# Patient Record
Sex: Female | Born: 1970 | Race: Black or African American | Hispanic: No | Marital: Single | Smoking: Never smoker
Health system: Southern US, Community
[De-identification: ages and names within clinical notes are randomized; demographics above are authoritative.]

## PROBLEM LIST (undated history)

## (undated) HISTORY — PX: ABDOMINAL HYSTERECTOMY: SHX81

---

## 2010-09-13 ENCOUNTER — Emergency Department (HOSPITAL_COMMUNITY): Payer: BC Managed Care – PPO

## 2010-09-13 ENCOUNTER — Emergency Department (HOSPITAL_COMMUNITY)
Admission: EM | Admit: 2010-09-13 | Discharge: 2010-09-13 | Disposition: A | Discharge status: Home / Self Care | Payer: BC Managed Care – PPO | Attending: Emergency Medicine | Admitting: Emergency Medicine

## 2010-09-13 DIAGNOSIS — D649 Anemia, unspecified: Secondary | ICD-10-CM | POA: Insufficient documentation

## 2010-09-13 DIAGNOSIS — R079 Chest pain, unspecified: Secondary | ICD-10-CM | POA: Insufficient documentation

## 2010-09-13 DIAGNOSIS — M940 Chondrocostal junction syndrome [Tietze]: Secondary | ICD-10-CM | POA: Insufficient documentation

## 2010-09-13 LAB — DIFFERENTIAL
Basophils Absolute: 0.1 10*3/uL (ref 0.0–0.1)
Lymphs Abs: 2.1 10*3/uL (ref 0.7–4.0)
Monocytes Absolute: 0.9 10*3/uL (ref 0.1–1.0)
Neutro Abs: 1.9 10*3/uL (ref 1.7–7.7)

## 2010-09-13 LAB — BASIC METABOLIC PANEL
Calcium: 9.5 mg/dL (ref 8.4–10.5)
Creatinine, Ser: 0.69 mg/dL (ref 0.50–1.10)
GFR calc non Af Amer: >60 mL/min (ref >60)
Glucose, Bld: 92 mg/dL (ref 70–99)
Sodium: 137 mEq/L (ref 135–145)

## 2010-09-13 LAB — TROPONIN I: Troponin I: <0.3 ng/mL (ref <0.30)

## 2010-09-13 LAB — CBC
MCH: 21.6 pg — ABNORMAL LOW (ref 26.0–34.0)
MCHC: 31.9 g/dL (ref 30.0–36.0)
MCV: 67.5 fL — ABNORMAL LOW (ref 78.0–100.0)
Platelets: 270 10*3/uL (ref 150–400)

## 2015-03-17 ENCOUNTER — Encounter (HOSPITAL_COMMUNITY): Payer: Self-pay | Admitting: Emergency Medicine

## 2015-03-17 ENCOUNTER — Emergency Department (HOSPITAL_COMMUNITY)
Admission: EM | Admit: 2015-03-17 | Discharge: 2015-03-17 | Disposition: A | Discharge status: Home / Self Care | Payer: BLUE CROSS/BLUE SHIELD | Attending: Emergency Medicine | Admitting: Emergency Medicine

## 2015-03-17 DIAGNOSIS — Z79899 Other long term (current) drug therapy: Secondary | ICD-10-CM | POA: Insufficient documentation

## 2015-03-17 DIAGNOSIS — R0981 Nasal congestion: Secondary | ICD-10-CM | POA: Diagnosis present

## 2015-03-17 DIAGNOSIS — B349 Viral infection, unspecified: Secondary | ICD-10-CM

## 2015-03-17 DIAGNOSIS — M791 Myalgia, unspecified site: Secondary | ICD-10-CM

## 2015-03-17 LAB — URINALYSIS, ROUTINE W REFLEX MICROSCOPIC
BILIRUBIN URINE: NEGATIVE
Glucose, UA: NEGATIVE mg/dL
HGB URINE DIPSTICK: NEGATIVE
Ketones, ur: NEGATIVE mg/dL
Leukocytes, UA: NEGATIVE
NITRITE: NEGATIVE
PROTEIN: NEGATIVE mg/dL
Specific Gravity, Urine: 1.024 (ref 1.005–1.030)
pH: 5.5 (ref 5.0–8.0)

## 2015-03-17 MED ORDER — KETOROLAC TROMETHAMINE 30 MG/ML IJ SOLN
30.0000 mg | Freq: Once | INTRAMUSCULAR | Status: AC
  Administered 2015-03-17: 30 mg via INTRAMUSCULAR
  Filled 2015-03-17: qty 1

## 2015-03-17 MED ORDER — ACETAMINOPHEN 325 MG PO TABS
650.0000 mg | ORAL_TABLET | Freq: Once | ORAL | Status: AC
  Administered 2015-03-17: 650 mg via ORAL
  Filled 2015-03-17: qty 2

## 2015-03-17 MED ORDER — OSELTAMIVIR PHOSPHATE 75 MG PO CAPS
75.0000 mg | ORAL_CAPSULE | Freq: Once | ORAL | Status: AC
  Administered 2015-03-17: 75 mg via ORAL
  Filled 2015-03-17: qty 1

## 2015-03-17 MED ORDER — FLUTICASONE PROPIONATE 50 MCG/ACT NA SUSP: 1.0000 | Freq: Every day | NASAL | Status: DC

## 2015-03-17 MED ORDER — IBUPROFEN 800 MG PO TABS: 800.0000 mg | ORAL_TABLET | Freq: Three times a day (TID) | ORAL | Status: DC

## 2015-03-17 MED ORDER — OSELTAMIVIR PHOSPHATE 75 MG PO CAPS: 75.0000 mg | ORAL_CAPSULE | Freq: Two times a day (BID) | ORAL | Status: DC

## 2015-03-17 NOTE — ED Notes (Addendum)
Friday morning began with nasal congestion, generalized body aches, and hot/cold chills. No cough, has used OTC medications without relief. Has been exposed to sick family member.

## 2015-03-17 NOTE — Discharge Instructions (Signed)
I will treat you empirically for the flu. We did not do a flu swab today. I will give you a prescription for Tamiflu to help with your symptoms. I will also give you prescriptions for nasal spray and ibuprofen.   Your urine test was completely normal today.  Take medications as prescribed. Return to the emergency room for worsening condition or new concerning symptoms. Follow up with your regular doctor. If you don't have a regular doctor use one of the numbers below to establish a primary care doctor.   Emergency Department Resource Guide 1) Find a Doctor and Pay Out of Pocket Although you won't have to find out who is covered by your insurance plan, it is a good idea to ask around and get recommendations. You will then need to call the office and see if the doctor you have chosen will accept you as a new patient and what types of options they offer for patients who are self-pay. Some doctors offer discounts or will set up payment plans for their patients who do not have insurance, but you will need to ask so you aren't surprised when you get to your appointment.  2) Contact Your Local Health Department Not all health departments have doctors that can see patients for sick visits, but many do, so it is worth a call to see if yours does. If you don't know where your local health department is, you can check in your phone book. The CDC also has a tool to help you locate your state's health department, and many state websites also have listings of all of their local health departments.  3) Find a Walk-in Clinic If your illness is not likely to be very severe or complicated, you may want to try a walk in clinic. These are popping up all over the country in pharmacies, drugstores, and shopping centers. They're usually staffed by nurse practitioners or physician assistants that have been trained to treat common illnesses and complaints. They're usually fairly quick and inexpensive. However, if you have  serious medical issues or chronic medical problems, these are probably not your best option.  No Primary Care Doctor: - Call Health Connect at  [phone] - they can help you locate a primary care doctor that  accepts your insurance, provides certain services, etc. - Physician Referral [phone]  Emergency Department Resource Guide 1) Find a Doctor and Pay Out of Pocket Although you won't have to find out who is covered by your insurance plan, it is a good idea to ask around and get recommendations. You will then need to call the office and see if the doctor you have chosen will accept you as a new patient and what types of options they offer for patients who are self-pay. Some doctors offer discounts or will set up payment plans for their patients who do not have insurance, but you will need to ask so you aren't surprised when you get to your appointment.  2) Contact Your Local Health Department Not all health departments have doctors that can see patients for sick visits, but many do, so it is worth a call to see if yours does. If you don't know where your local health department is, you can check in your phone book. The CDC also has a tool to help you locate your state's health department, and many state websites also have listings of all of their local health departments.  3) Find a Walk-in Clinic If your illness is not likely to  be very severe or complicated, you may want to try a walk in clinic. These are popping up all over the country in pharmacies, drugstores, and shopping centers. They're usually staffed by nurse practitioners or physician assistants that have been trained to treat common illnesses and complaints. They're usually fairly quick and inexpensive. However, if you have serious medical issues or chronic medical problems, these are probably not your best option.  No Primary Care Doctor: - Call Health Connect at  [phone] - they can help you locate a primary care doctor  that  accepts your insurance, provides certain services, etc. - Physician Referral Service- [phone]  Chronic Pain Problems: Organization         Address  Phone   Notes  [facility]  [phone] Patients need to be referred by their primary care doctor.   Medication Assistance: Organization         Address  Phone   Notes  [facility] Medication [facility] [address] [place] [place], [place] [address] [phone] --Must be a resident of [facility] -- Must have NO insurance coverage whatsoever (no Medicaid/ Medicare, etc.) -- The pt. MUST have a primary care doctor that directs their care regularly and follows them in the community   [facility]  [phone]   [employer]  [phone]    Agencies that provide inexpensive medical care: Organization         Address  Phone   Notes  [facility]  [phone]   [facility]    [phone]   [facility] [address] [place], [place] [address] [phone]   [facility] [address] [place] [place] [phone]   [facility]anned Parenthood    [phone]   [facility]    [phone]   [facility]  [address], [place] Phone:  [phone], Fax:  [phone] Hours of Operation:  9 am - 6 pm, M-F.  Also accepts Medicaid/Medicare and self-pay.  [facility]  [address], [place] Phone: [phone], Fax: [phone]. Hours of Operation:  8:30 am - 5:30 pm, M-F.  Also accepts Medicaid and self-pay.  [facility] [place] [address] [place] Phone: [phone]   [facility] [address] Natasha Bence [place], [place] [phone], Ext. [phone] Mondays & Thursdays: 7-9 AM.  First 15 patients are seen on a first come, first serve basis.    Medicaid-accepting [facility] Providers:  Organization          Address  Phone   Notes  [facility] [address], [place] [phone] Also accepts self-pay patients.  [facility] [address] Laurell Josephs [place], [place]  [phone]   [facility] [address], [place] [phone]   [facility] [address], [place] [phone]   Renaye Rakers [address], [place]   [phone] Only accepts [place] patients after they have their name applied to their card.   Self-Pay (no insurance) in [facility]:  Organization         Address  Phone   Notes  Sickle Cell Patients, [facility] [address] [place], [place] [phone]   [facility] [place] [phone]   [facility] [address], [place] [phone]   [facility]/Dr. Osei-Bonsu  [address], [place] [address], [place] [phone] Phone number for both [place] and [place] locations is the same.  [facility] and [facility] [address] [place] [phone]   [facility] [address], [place] or [address] [phone]   [facility] [address] [place] [phone], phone; [phone], fax Sees patients 1st and 3rd Saturday of every month.  Must not qualify for public or private insurance (i.e. Medicaid, Medicare, [place] Health Choice, Veterans' Benefits)  Household income should be no more than 200% of the poverty level The clinic cannot treat you if you are pregnant or think you are pregnant  Sexually transmitted diseases are not treated at the clinic.

## 2015-03-17 NOTE — ED Provider Notes (Signed)
CSN: [id number]     Arrival date & time 03/17/15  0848 History   First MD Initiated Contact with Patient 03/17/15 [phone]     Chief Complaint  Patient presents with  . Generalized Body Aches  . Nasal Congestion    HPI   Carmen Burnett is an 45 y.o. female with no significant PMH who presents to the ED for evaluation of nasal congestion, body aches, fever, chills. She stats her symptoms began two days ago. Denies cough. Denies chest pain, SOB. She states she is unsure of temperature at home but has felt subjectively feverish. Denies abdominal pain, n/v/d. She states she has tried OTC meds such as Theraflu with no relief. Denies sick contacts.  History reviewed. No pertinent past medical history. Past Surgical History  Procedure Laterality Date  . Abdominal hysterectomy     History reviewed. No pertinent family history. Social History  Substance Use Topics  . Smoking status: None  . Smokeless tobacco: None  . Alcohol Use: None   OB History    No data available     Review of Systems  All other systems reviewed and are negative.     Allergies  Review of patient's allergies indicates no known allergies.  Home Medications   Prior to Admission medications   Medication Sig Start Date End Date Taking? Authorizing Provider  Calcium Carbonate (CALTRATE 600 PO) Take 2 tablets by mouth daily.   Yes Historical Provider, MD  Phenyleph-Doxylamine-DM-APAP (ALKA SELTZER PLUS PO) Take 2 tablets by mouth 3 (three) times daily as needed (for congestion and cold symptoms).   Yes Historical Provider, MD   BP 144/100 mmHg  Pulse 97  Temp(Src) 98.1 F (36.7 C) (Oral)  Resp 18  SpO2 100% Physical Exam  Constitutional: She is oriented to person, place, and time. No distress.  HENT:  Head: Atraumatic.  Right Ear: External ear normal.  Left Ear: External ear normal.  Nose: Mucosal edema and rhinorrhea present.  Mouth/Throat: Oropharynx is clear and moist. No oropharyngeal exudate.  Eyes:  Conjunctivae are normal. No scleral icterus.  Neck: Normal range of motion. Neck supple.  Cardiovascular: Normal rate, regular rhythm and normal heart sounds.   Pulmonary/Chest: Effort normal and breath sounds normal. No respiratory distress. She has no wheezes. She exhibits no tenderness.  Abdominal: Soft. She exhibits no distension. There is no tenderness. There is no CVA tenderness.  Neurological: She is alert and oriented to person, place, and time.  Skin: Skin is warm and dry. She is not diaphoretic.  Psychiatric: She has a normal mood and affect. Her behavior is normal.  Nursing note and vitals reviewed.  Filed Vitals:   03/17/15 0859 03/17/15 1033  BP: 144/100 141/95  Pulse: 97 70  Temp: 98.1 F (36.7 C)   TempSrc: Oral   Resp: 18 16  SpO2: 100% 98%     ED Course  Procedures (including critical care time) Labs Review Labs Reviewed  URINALYSIS, ROUTINE W REFLEX MICROSCOPIC (NOT AT [facility])    Imaging Review No results found. I have personally reviewed and evaluated these images and lab results as part of my medical decision-making.   EKG Interpretation None      MDM   Final diagnoses:  Viral syndrome  Myalgia  Nasal congestion    Will treat empirically for flu. First dose tamiflu given in the ED. Pt also given Toradol and tylenol. She does report improvement of symptoms. I was notified by RN that pt reports two weeks of intermittent right  flank pain and dysuria. She would like a UA. On re-eval pt does not have any CVA or abdominal tenderness. Will check UA.  UA negative. Discussed with pt. At this time will hold off on further workup. SHe is nontoxic appearing and afebrile. I do suspect she has viral illness, possibly flu. Rx given for tamiflu, motrin, and flonase. Instructed to f/u with PCP. ER return precautions given.    Carlene Coria, PA-C 03/18/15 1459  Zadie Rhine, MD 03/18/15 [phone]

## 2015-03-17 NOTE — ED Notes (Signed)
Pt adds that she has R sided back and flank pain x2 weeks with some burning upon urination. Pt requests to have urine checked. Casimer Bilis made aware

## 2016-12-11 ENCOUNTER — Encounter (HOSPITAL_COMMUNITY): Payer: Self-pay | Admitting: Nurse Practitioner

## 2016-12-11 DIAGNOSIS — Z5321 Procedure and treatment not carried out due to patient leaving prior to being seen by health care provider: Secondary | ICD-10-CM | POA: Insufficient documentation

## 2016-12-11 DIAGNOSIS — M791 Myalgia, unspecified site: Secondary | ICD-10-CM | POA: Insufficient documentation

## 2016-12-11 NOTE — ED Triage Notes (Signed)
Pt states since she received the flu shot about six weeks ago, she has been experiencing flu-like symptoms in particular body aches.

## 2016-12-12 ENCOUNTER — Emergency Department (HOSPITAL_COMMUNITY)
Admission: EM | Admit: 2016-12-12 | Discharge: 2016-12-12 | Discharge status: Against Medical Advice | Payer: BLUE CROSS/BLUE SHIELD | Attending: Emergency Medicine | Admitting: Emergency Medicine

## 2016-12-12 NOTE — ED Notes (Signed)
No answer for room. Not seen in lobby. 

## 2016-12-12 NOTE — ED Notes (Signed)
Pt turned in her stickers to registration. LWBS

## 2017-09-20 ENCOUNTER — Other Ambulatory Visit: Payer: Self-pay

## 2017-09-20 ENCOUNTER — Emergency Department (HOSPITAL_COMMUNITY): Payer: Self-pay

## 2017-09-20 ENCOUNTER — Encounter (HOSPITAL_COMMUNITY): Payer: Self-pay | Admitting: *Deleted

## 2017-09-20 ENCOUNTER — Emergency Department (HOSPITAL_COMMUNITY)
Admission: EM | Admit: 2017-09-20 | Discharge: 2017-09-20 | Disposition: A | Discharge status: Home / Self Care | Payer: Self-pay | Attending: Emergency Medicine | Admitting: Emergency Medicine

## 2017-09-20 DIAGNOSIS — Z79899 Other long term (current) drug therapy: Secondary | ICD-10-CM | POA: Insufficient documentation

## 2017-09-20 DIAGNOSIS — R0789 Other chest pain: Secondary | ICD-10-CM | POA: Insufficient documentation

## 2017-09-20 DIAGNOSIS — R202 Paresthesia of skin: Secondary | ICD-10-CM | POA: Insufficient documentation

## 2017-09-20 LAB — I-STAT TROPONIN, ED
TROPONIN I, POC: 0 ng/mL (ref 0.00–0.08)
TROPONIN I, POC: 0.01 ng/mL (ref 0.00–0.08)

## 2017-09-20 LAB — CBC
HCT: 40.7 % (ref 36.0–46.0)
Hemoglobin: 12.4 g/dL (ref 12.0–15.0)
MCH: 23.3 pg — ABNORMAL LOW (ref 26.0–34.0)
MCHC: 30.5 g/dL (ref 30.0–36.0)
MCV: 76.5 fL — ABNORMAL LOW (ref 78.0–100.0)
Platelets: 273 10*3/uL (ref 150–400)
RBC: 5.32 MIL/uL — AB (ref 3.87–5.11)
RDW: 15.9 % — ABNORMAL HIGH (ref 11.5–15.5)
WBC: 6.4 10*3/uL (ref 4.0–10.5)

## 2017-09-20 LAB — BASIC METABOLIC PANEL
Anion gap: 12 (ref 5–15)
BUN: 17 mg/dL (ref 6–20)
CALCIUM: 9.1 mg/dL (ref 8.9–10.3)
CO2: 25 mmol/L (ref 22–32)
CREATININE: 0.75 mg/dL (ref 0.44–1.00)
Chloride: 102 mmol/L (ref 98–111)
Glucose, Bld: 104 mg/dL — ABNORMAL HIGH (ref 70–99)
Potassium: 3.9 mmol/L (ref 3.5–5.1)
SODIUM: 139 mmol/L (ref 135–145)

## 2017-09-20 LAB — D-DIMER, QUANTITATIVE (NOT AT ARMC)

## 2017-09-20 MED ORDER — GI COCKTAIL ~~LOC~~
30.0000 mL | Freq: Once | ORAL | Status: AC
  Administered 2017-09-20: 30 mL via ORAL
  Filled 2017-09-20: qty 30

## 2017-09-20 MED ORDER — KETOROLAC TROMETHAMINE 30 MG/ML IJ SOLN
30.0000 mg | Freq: Once | INTRAMUSCULAR | Status: AC
  Administered 2017-09-20: 30 mg via INTRAVENOUS
  Filled 2017-09-20: qty 1

## 2017-09-20 MED ORDER — METHYLPREDNISOLONE 4 MG PO TBPK: ORAL_TABLET | ORAL | 0 refills | Status: DC

## 2017-09-20 NOTE — ED Notes (Signed)
Taken to xray at this time. 

## 2017-09-20 NOTE — ED Provider Notes (Addendum)
[facility] EMERGENCY DEPARTMENT Provider Note   CSN: [id number] Arrival date & time: 09/20/17  0404     History   Chief Complaint Chief Complaint  Patient presents with  . Chest Pain    HPI Carmen Burnett is a 47 y.o. female.  Patient presents with central chest pressure that has been constant since about midnight.  It does wax and wane in severity but never goes away completely.  She describes ongoing numbness and tingling in her right arm which has been an issue for the past month which she has seen her PCP for and given Flexeril and diclofenac.  She reports there is no change in the numbness in her right arm today.  No weakness.  She noticed some left arm numbness ongoing when she had the chest pain this morning.  She denies any shortness of breath, nausea, vomiting, diaphoresis or syncope.  Chest pain is better with sitting up and worse with lying down.  It is not exertional or pleuritic.  Denies any abdominal pain.  She reports no cardiac history but is never had a stress test.  Denies any history of hypertension, diabetes, high cholesterol, or tobacco abuse.  She did not try to take any medications at home.  She reports the right arm numbness is the entire arm and is unchanged today. She reports she did hit her head last month several days before the R arm numbness started. But she never did have midline neck pain.  The history is provided by the patient.  Chest Pain   Associated symptoms include numbness and shortness of breath. Pertinent negatives include no abdominal pain, no dizziness, no fever, no headaches, no nausea and no vomiting.    History reviewed. No pertinent past medical history.  There are no active problems to display for this patient.   Past Surgical History:  Procedure Laterality Date  . ABDOMINAL HYSTERECTOMY       OB History   None      Home Medications    Prior to Admission medications   Medication Sig Start Date End Date  Taking? Authorizing Provider  cyclobenzaprine (FLEXERIL) 10 MG tablet Take 10 mg by mouth 3 (three) times daily as needed for muscle spasms.  08/28/17  Yes [provider]  diclofenac (VOLTAREN) 75 MG EC tablet Take 75 mg by mouth 2 (two) times daily. 08/28/17  Yes [provider]  fluticasone (FLONASE) 50 MCG/ACT nasal spray Place 1 spray into both nostrils daily. Patient not taking: Reported on 09/20/2017 03/17/15   Sam, Ace Gins, PA-C  ibuprofen (ADVIL,MOTRIN) 800 MG tablet Take 1 tablet (800 mg total) by mouth 3 (three) times daily. Patient not taking: Reported on 09/20/2017 03/17/15   Carlene Coria, PA-C  oseltamivir (TAMIFLU) 75 MG capsule Take 1 capsule (75 mg total) by mouth every 12 (twelve) hours. Patient not taking: Reported on 09/20/2017 03/17/15   Carlene Coria, PA-C    Family History No family history on file.  Social History Social History   Tobacco Use  . Smoking status: Never Smoker  . Smokeless tobacco: Never Used  Substance Use Topics  . Alcohol use: Yes  . Drug use: Not Currently     Allergies   Patient has no known allergies.   Review of Systems Review of Systems  Constitutional: Negative for activity change, appetite change and fever.  Eyes: Negative for visual disturbance.  Respiratory: Positive for chest tightness and shortness of breath.   Cardiovascular: Positive for chest  pain.  Gastrointestinal: Negative for abdominal pain, nausea and vomiting.  Genitourinary: Negative for dysuria, pelvic pain, vaginal bleeding and vaginal discharge.  Musculoskeletal: Negative for arthralgias and myalgias.  Skin: Negative for rash.  Neurological: Positive for numbness. Negative for dizziness and headaches.   all other systems are negative except as noted in the HPI and PMH.     Physical Exam Updated Vital Signs BP (!) 151/88   Pulse 73   Temp 97.8 F (36.6 C) (Oral)   Resp 18   SpO2 100%   Physical Exam  Constitutional: She is oriented to  person, place, and time. She appears well-developed and well-nourished. No distress.  Appears anxious  HENT:  Head: Normocephalic and atraumatic.  Mouth/Throat: Oropharynx is clear and moist. No oropharyngeal exudate.  Eyes: Pupils are equal, round, and reactive to light. Conjunctivae and EOM are normal.  Neck: Normal range of motion. Neck supple.  No meningismus.  Cardiovascular: Normal rate, regular rhythm, normal heart sounds and intact distal pulses.  No murmur heard. Pulmonary/Chest: Effort normal and breath sounds normal. No respiratory distress. She exhibits no tenderness.  Abdominal: Soft. There is no tenderness. There is no rebound and no guarding.  Musculoskeletal: Normal range of motion. She exhibits no edema or tenderness.  Subjective numbness in the right arm extending from paraspinal cervical musculature all the way down the arm.  Grip strengths are equal bilaterally with intact radial pulses.  Neurological: She is alert and oriented to person, place, and time. No cranial nerve deficit. She exhibits normal muscle tone. Coordination normal.   5/5 strength throughout. CN 2-12 intact.Equal grip strength.   Skin: Skin is warm. Capillary refill takes less than 2 seconds.  Psychiatric: She has a normal mood and affect. Her behavior is normal.  Nursing note and vitals reviewed.    ED Treatments / Results  Labs (all labs ordered are listed, but only abnormal results are displayed) Labs Reviewed  BASIC METABOLIC PANEL - Abnormal; Notable for the following components:      Result Value   Glucose, Bld 104 (*)    All other components within normal limits  CBC - Abnormal; Notable for the following components:   RBC 5.32 (*)    MCV 76.5 (*)    MCH 23.3 (*)    RDW 15.9 (*)    All other components within normal limits  D-DIMER, QUANTITATIVE (NOT AT [facility])  I-STAT TROPONIN, ED    EKG EKG Interpretation  Date/Time:  Monday September 20 2017 04:11:41 EDT Ventricular Rate:  76 PR  Interval:    QRS Duration: 86 QT Interval:  398 QTC Calculation: 448 R Axis:   97 Text Interpretation:  Sinus rhythm Borderline right axis deviation No acute changes No significant change since last tracing Confirmed by Derwood Kaplan ([id number]) on 09/20/2017 4:27:17 AM   Radiology Dg Chest 2 View  Result Date: 09/20/2017 CLINICAL DATA:  47 year old female with chest pain. EXAM: CHEST - 2 VIEW COMPARISON:  Chest radiograph dated 09/13/2010 FINDINGS: The heart size and mediastinal contours are within normal limits. Both lungs are clear. The visualized skeletal structures are unremarkable. IMPRESSION: No active cardiopulmonary disease. Electronically Signed   By: Elgie Collard M.D.   On: 09/20/2017 05:09   Ct Cervical Spine Wo Contrast  Result Date: 09/20/2017 CLINICAL DATA:  47 y/o F; right-sided chest pain and back pain radiating to the bilateral arms with numbness for 3 weeks. EXAM: CT CERVICAL SPINE WITHOUT CONTRAST TECHNIQUE: Multidetector CT imaging of the cervical spine was  performed without intravenous contrast. Multiplanar CT image reconstructions were also generated. COMPARISON:  None. FINDINGS: Alignment: Straightening of cervical lordosis without listhesis. Skull base and vertebrae: No acute fracture. No primary bone lesion or focal pathologic process. Soft tissues and spinal canal: No prevertebral fluid or swelling. No visible canal hematoma. Disc levels: C5-6 discogenic degenerative changes with prominent left-sided uncovertebral hypertrophy. Mild left-sided bony foraminal stenosis. No significant canal stenosis. Upper thoracic spine facet arthropathy partially visualized. Upper chest: Negative. Other: None. IMPRESSION: 1. No acute fracture or dislocation of the cervical spine. 2. Mild discogenic degenerative changes at the C5-6 level with prominent left-sided uncovertebral hypertrophy. Mild left C5-6 bony foraminal stenosis. No significant canal stenosis. Electronically Signed   By: Mitzi HansenLance   Furusawa-Stratton M.D.   On: 09/20/2017 05:48    Procedures Procedures (including critical care time)  Medications Ordered in ED Medications  gi cocktail (Maalox,Lidocaine,Donnatal) (has no administration in time range)  ketorolac (TORADOL) 30 MG/ML injection 30 mg (has no administration in time range)     Initial Impression / Assessment and Plan / ED Course  I have reviewed the triage vital signs and the nursing notes.  Pertinent labs & imaging results that were available during my care of the patient were reviewed by me and considered in my medical decision making (see chart for details).    Central chest tightness that woke her from sleep that is worse with lying down and better with sitting up.  EKG is nonischemic.  Low suspicion for ACS, pulmonary embolism or aortic dissection. Patient's with right arm numbness is been ongoing for the past month that is unchanged today.  She had a negative C-spine x-ray at her PCPs office.  She denies any midline neck pain.  Troponin is negative.  D-dimer is negative.  Pain is somewhat improved after GI cocktail and Toradol. CT scan of neck obtained given her fall a month ago with persistent right arm numbness since then.  She had a negative x-ray by her PCP.  She has no strength deficit on exam but does have subjective paresthesias.  CT scan shows no fracture or dislocation.  There are mild degenerative changes on the left side. Will treat suspected cervical radiculopathy. No weakness on exam. No indication for emergent C spine MRI today.  Discussed with patient that there is no evidence of MI though she should follow-up with her PCP for a stress test.  We will plan for second troponin.  If this is negative anticipate discharge home with outpatient cardiology follow-up.  Return precautions discussed including worsening chest pain, exertional chest pain, shortness of breath, nausea, vomiting, diaphoresis or any other concerns.  Final Clinical  Impressions(s) / ED Diagnoses   Final diagnoses:  Atypical chest pain    ED Discharge Orders    None       Tiphany Fayson, Jeannett SeniorStephen, MD 09/20/17 [id number]    Glynn Octaveancour, Bates [place], MD 09/20/17 [phone]

## 2017-09-20 NOTE — ED Triage Notes (Signed)
Pt has been having sharp, pressure type chest pain for the past week with L forearm and R shoulder numbness. Also reports mild sob.

## 2017-09-20 NOTE — ED Provider Notes (Signed)
Care assumed from overnight team, Dr. Manus Gunningancour.   At time of transfer care, patient is awaiting her delta troponin.  Plan of care is to discharge patient if she continues to feel well, her vitals are reassuring, and the second troponin is negative.  Second troponin was also negative.  Patient had negative d-dimer and otherwise work-up was reassuring.  Patient will follow-up with her PCP and likely a cardiologist.  Patient understands return precautions and follow-up instructions.  Patient no depressions or concerns and was discharged in good condition after second troponin was negative.  Clinical Impression: 1. Atypical chest pain   2. Arm paresthesia, right     Disposition: Discharge  Condition: Good  I have discussed the results, Dx and Tx plan with the pt(& family if present). He/she/they expressed understanding and agree(s) with the plan. Discharge instructions discussed at great length. Strict return precautions discussed and pt &/or family have verbalized understanding of the instructions. No further questions at time of discharge.     New Prescriptions   METHYLPREDNISOLONE (MEDROL DOSEPAK) 4 MG TBPK TABLET    As directed    [facility] [address] [facility] [address], [address] [place] [id number] [phone] Schedule an appointment as soon as possible for a visit in 2 days   [facility] [address] [id number] [place] [id number] [phone]    [facility] [address] [place] [phone] Schedule an appointment as soon as possible for a visit       Benson Porcaro, Canary Brimhristopher J, MD 09/20/17 [phone]

## 2017-09-20 NOTE — ED Notes (Signed)
Back from xray

## 2017-09-20 NOTE — Discharge Instructions (Addendum)
There is no evidence of heart attack or blood clot in the lung.  Follow-up with your doctor for a stress test.  Take the steroids for a possible pinched nerve in your neck. Return to the ED if you develop new or worsening symptoms.

## 2020-04-02 IMAGING — DX DG CHEST 2V
2 series · 2 of 2 positions shown · non-contrast
Comparison: Chest radiograph dated 09/13/2010

CLINICAL DATA: 38-year-old female with chest pain.

EXAM:
CHEST - 2 VIEW

[chest pa]
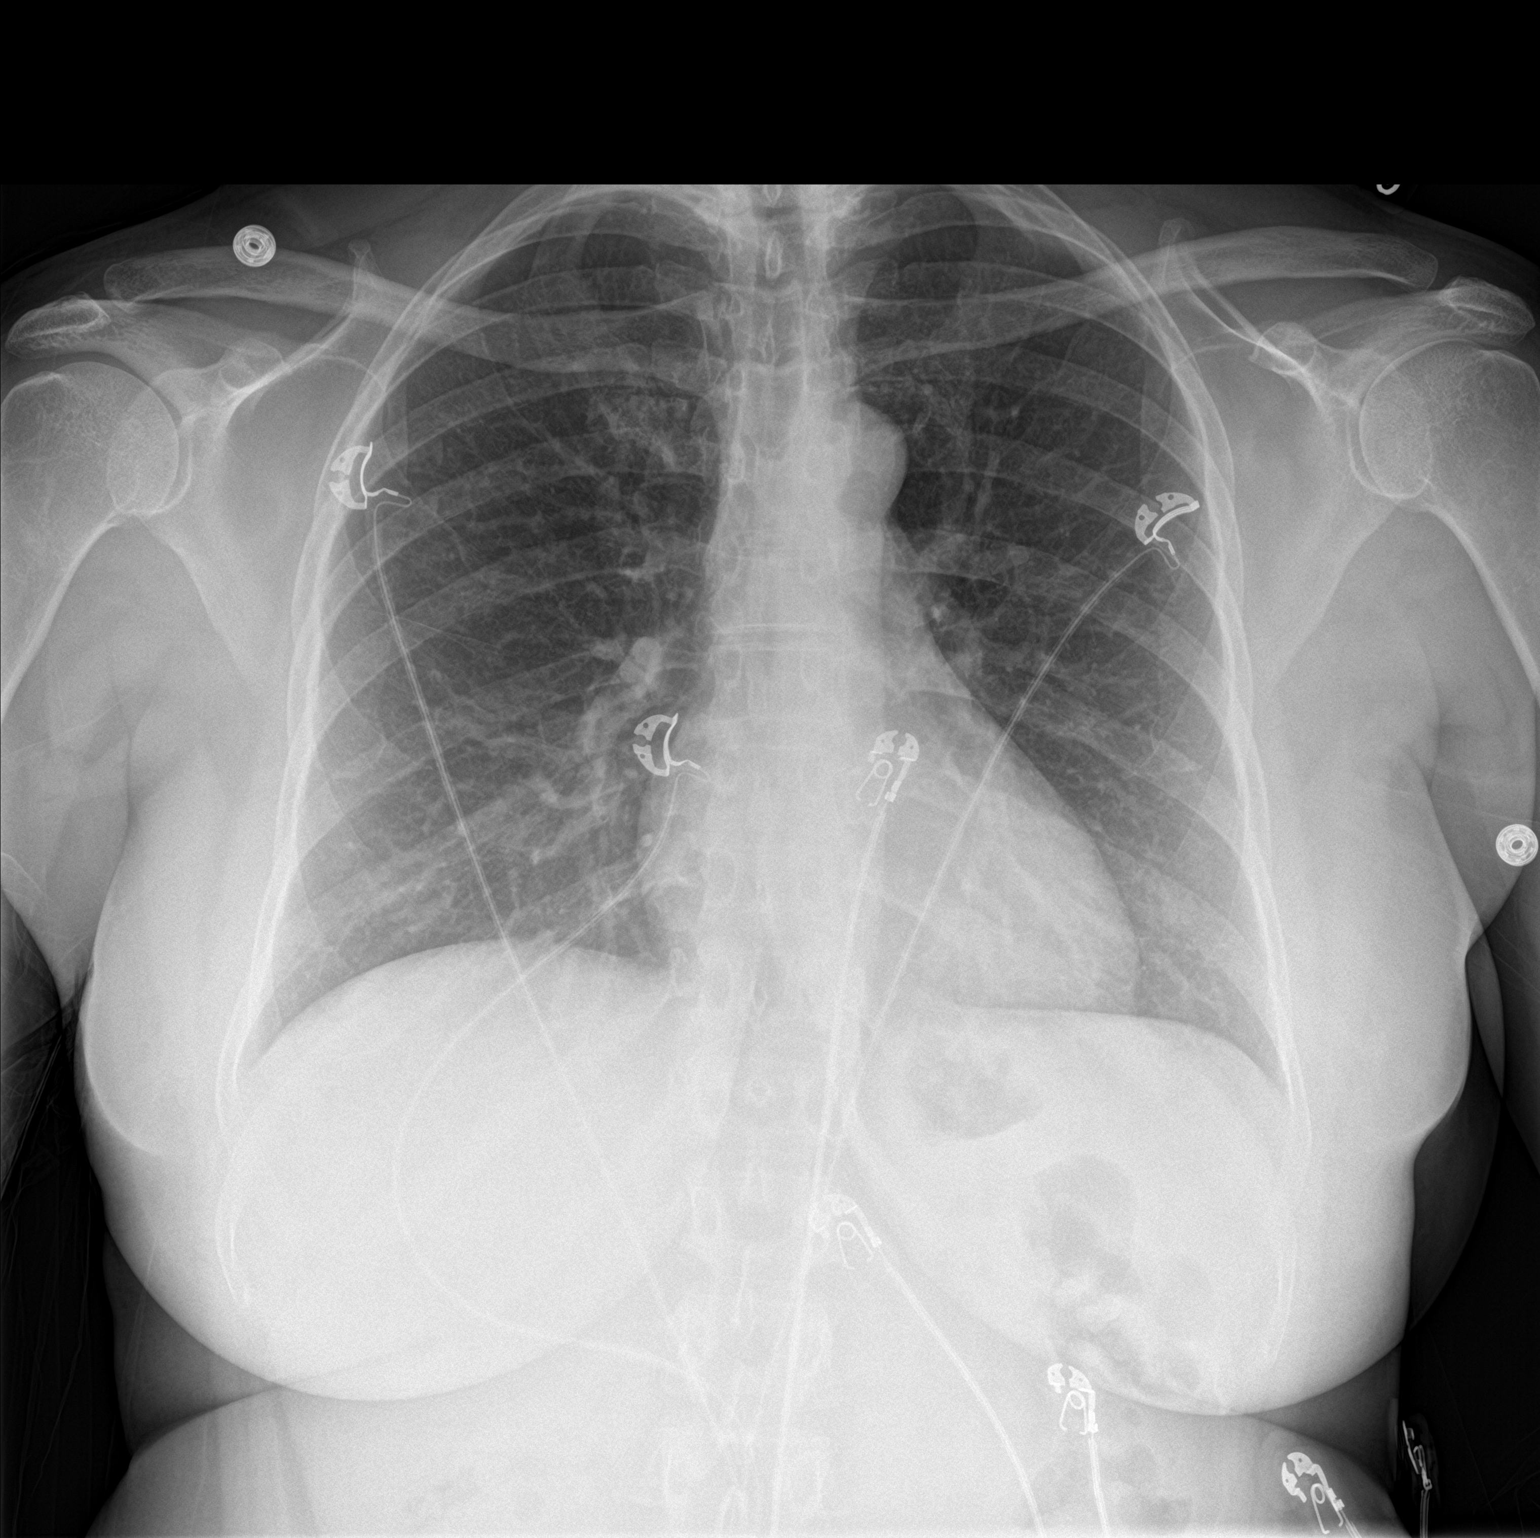

[chest lat]
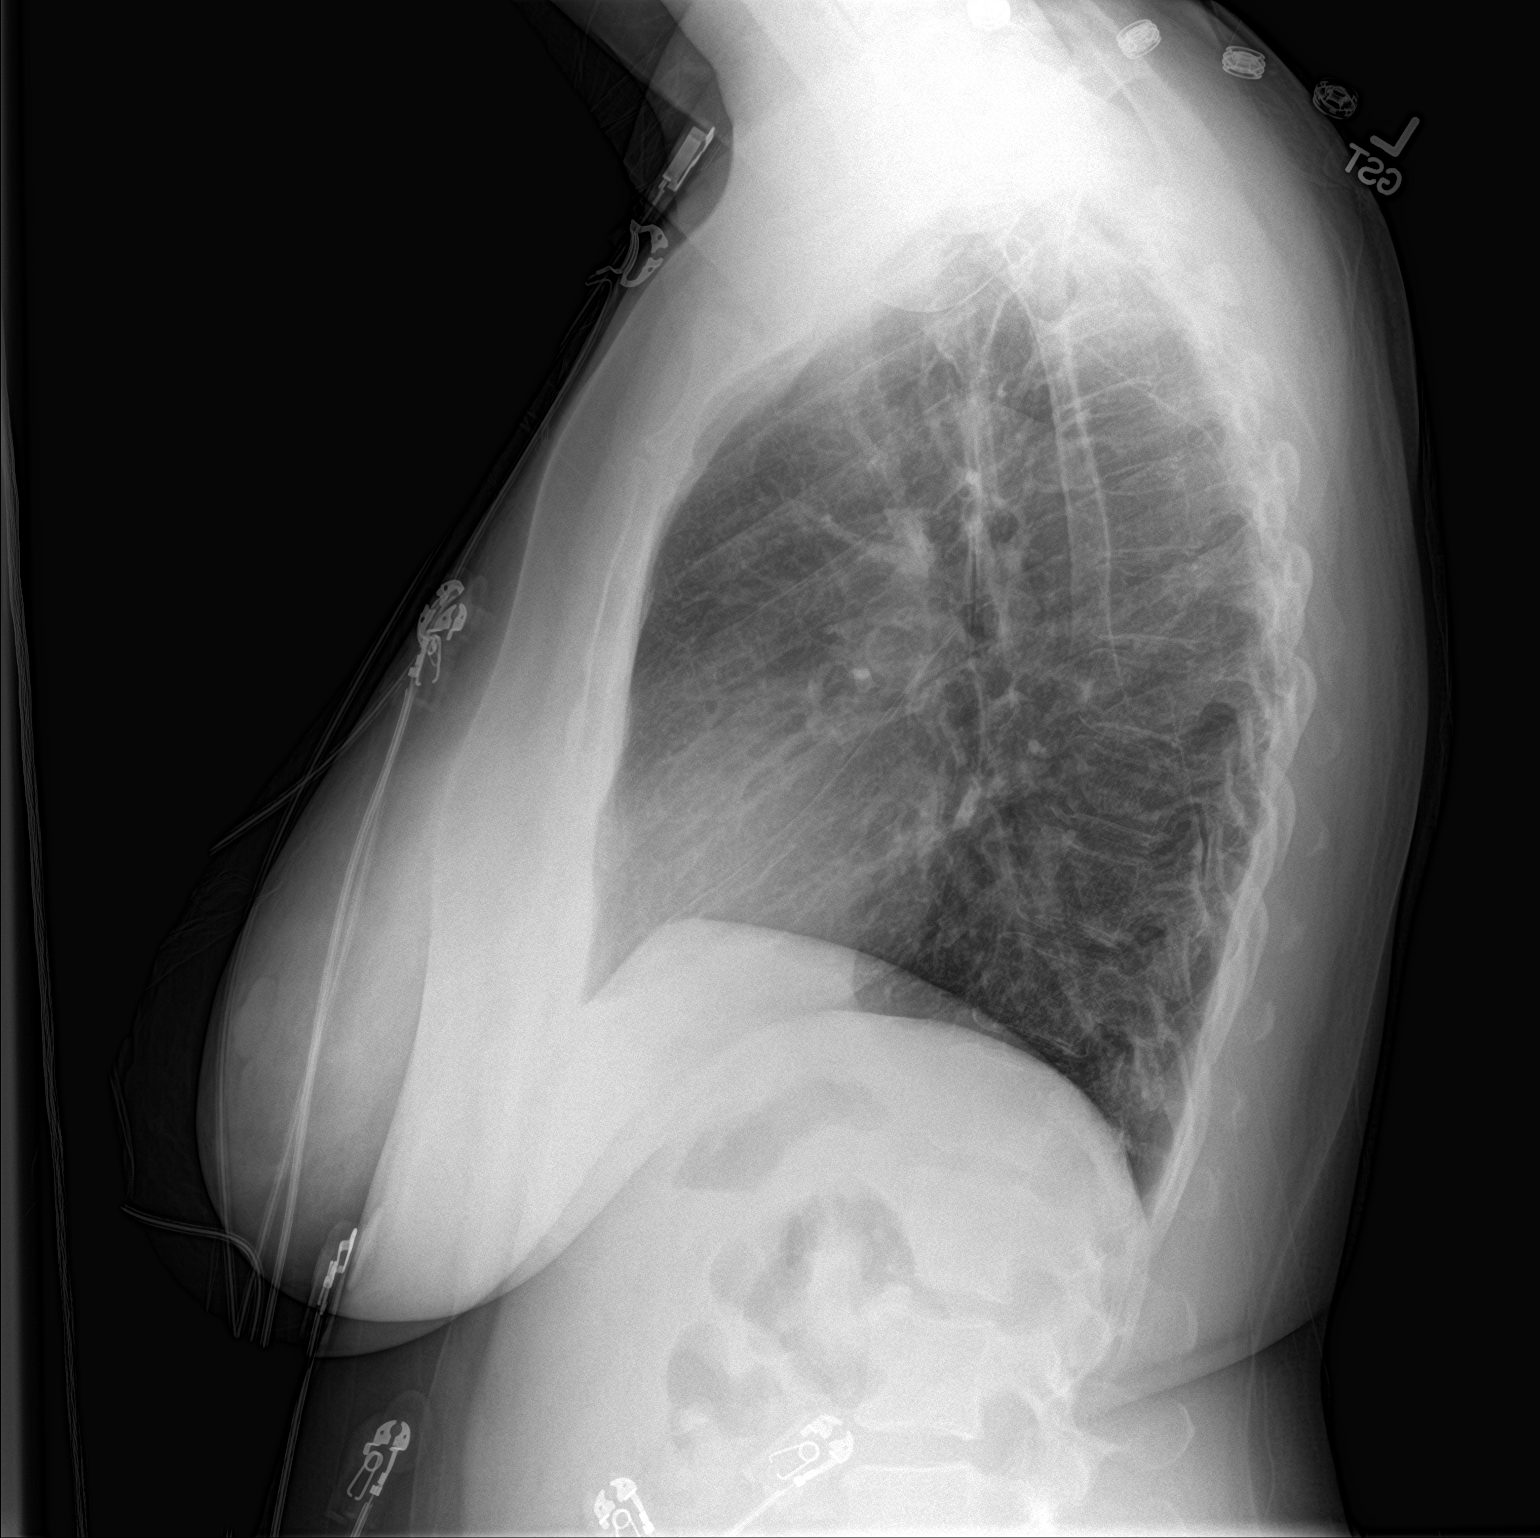

[2 of 2 positions shown; findings below may reference images not displayed]

FINDINGS: The heart size and mediastinal contours are within normal limits.
Both lungs are clear. The visualized skeletal structures are
unremarkable.
IMPRESSION: No active cardiopulmonary disease.

## 2020-04-02 IMAGING — CT CT CERVICAL SPINE W/O CM
4 series · 16 of 33 positions shown, 18 images · non-contrast
Comparison: None.

CLINICAL DATA: 46 y/o F; right-sided chest pain and back pain
radiating to the bilateral arms with numbness for 3 weeks.

EXAM:
CT CERVICAL SPINE WITHOUT CONTRAST
TECHNIQUE: Multidetector CT imaging of the cervical spine was performed without
intravenous contrast. Multiplanar CT image reconstructions were also
generated.

[Series 4: c spine soft · axial · 0.31mm/px · z∈[+1118,+1190]mm · 4 of 84 slices shown]
[im 12/84  soft-tissue]
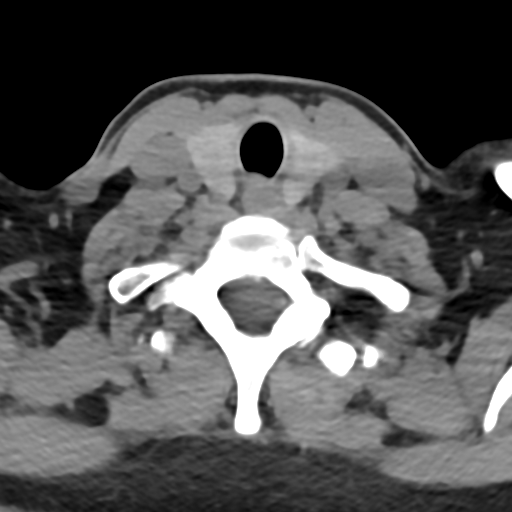
[im 24/84  soft-tissue]
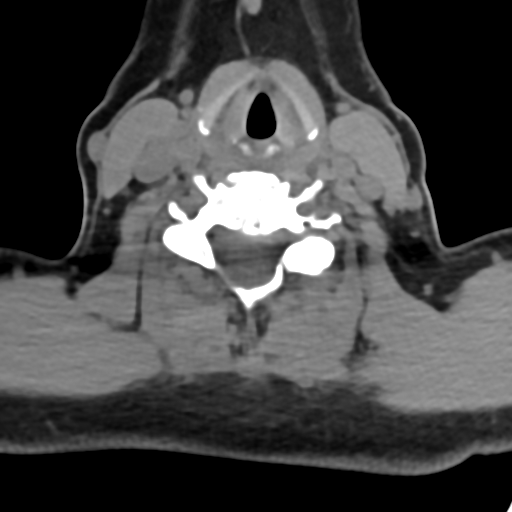
[im 36/84  soft-tissue]
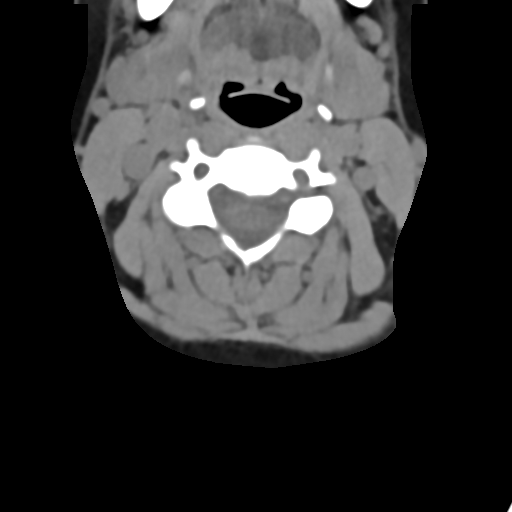
[im 48/84  soft-tissue]
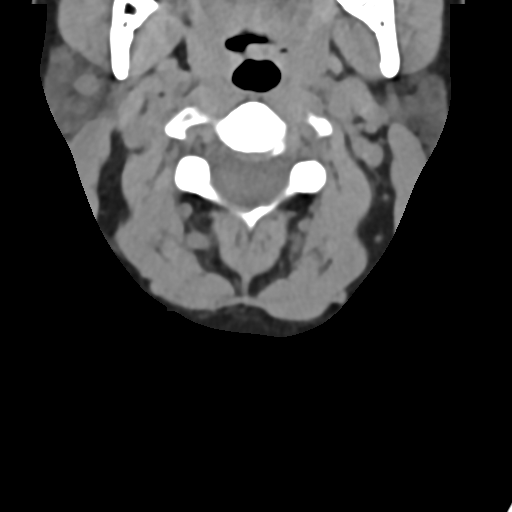

[Series 5: sag bone · sagittal · 0.30mm/px · 5 of 61 slices shown, 6 images]
[im 21/61  bone]
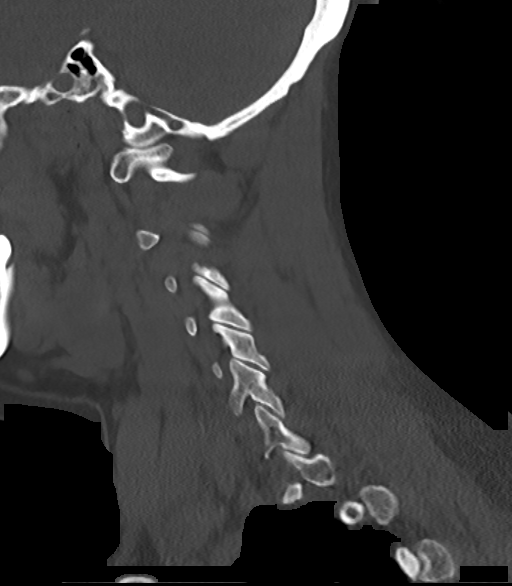
[im 26/61  bone]
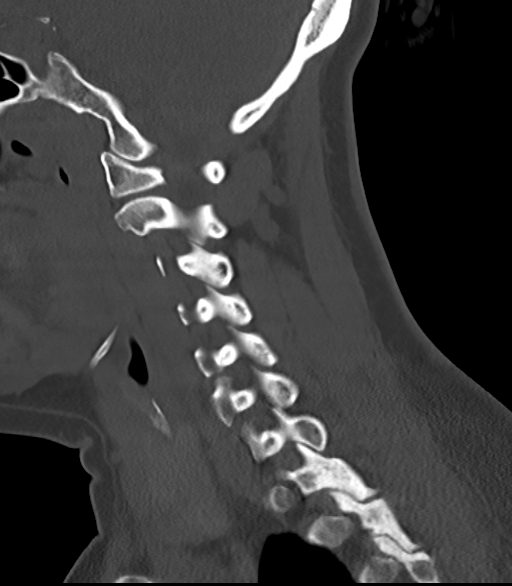
[im 31/61  soft-tissue]
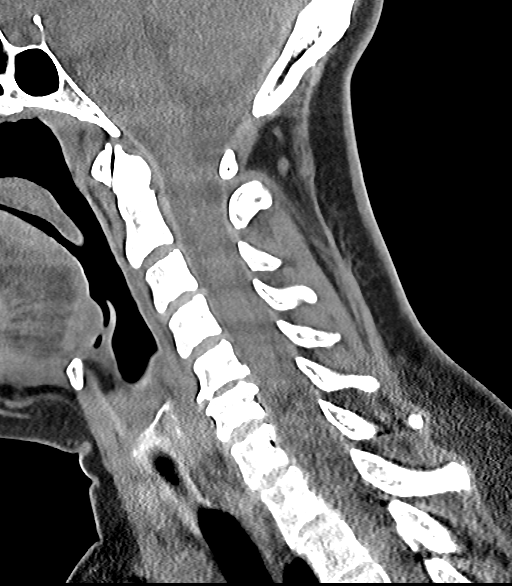
[im 31/61  bone]
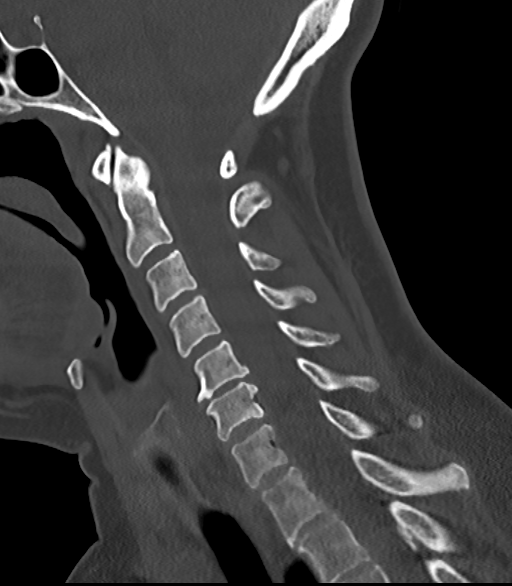
[im 36/61  bone]
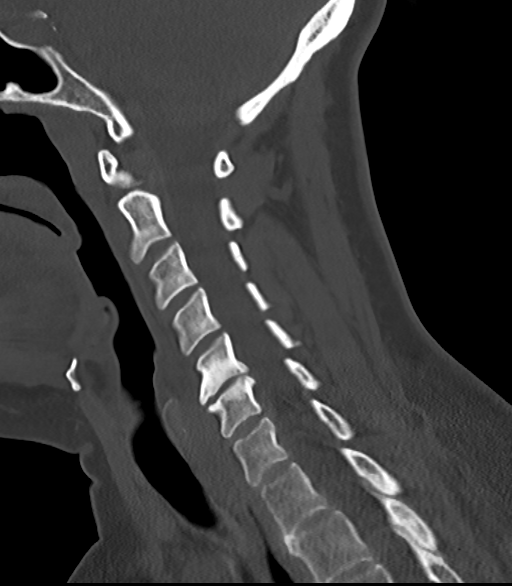
[im 41/61  bone]
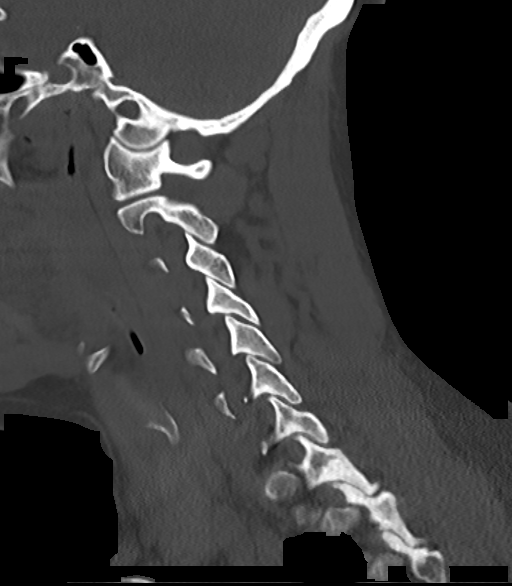

[Series 7: cor bone · coronal · 0.25mm/px · 3 of 76 slices shown]
[im 20/76  bone]
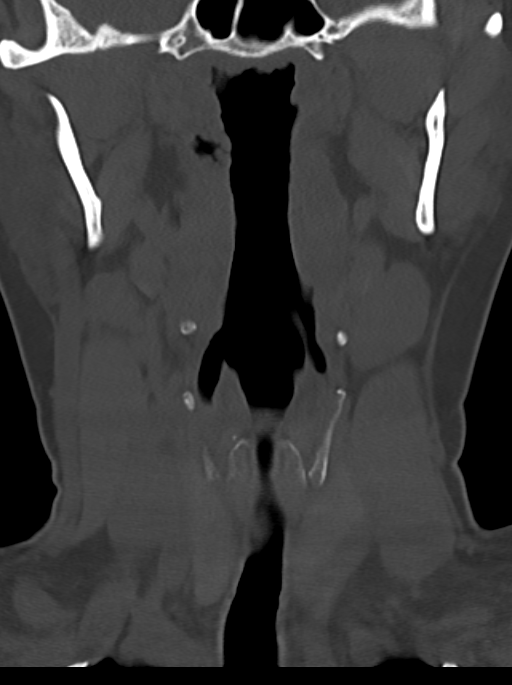
[im 32/76  bone]
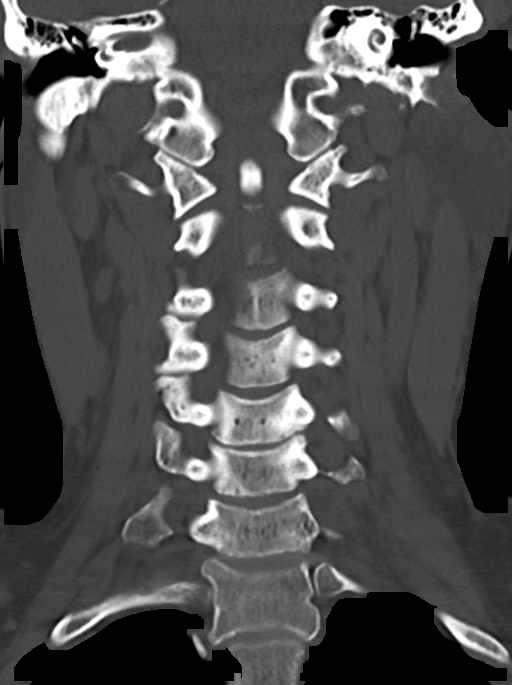
[im 44/76  bone]
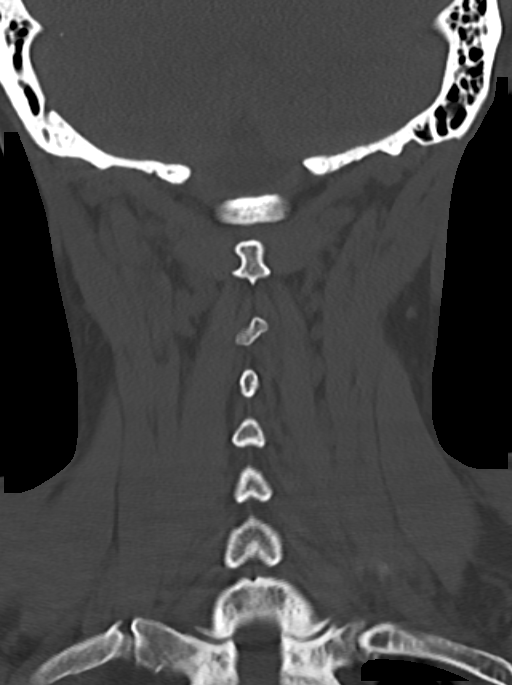

[Series 8: orthogonal axials · axial · 0.21mm/px · z∈[+1125,+1194]mm · 4 of 70 slices shown, 5 images]
[im 14/70  soft-tissue]
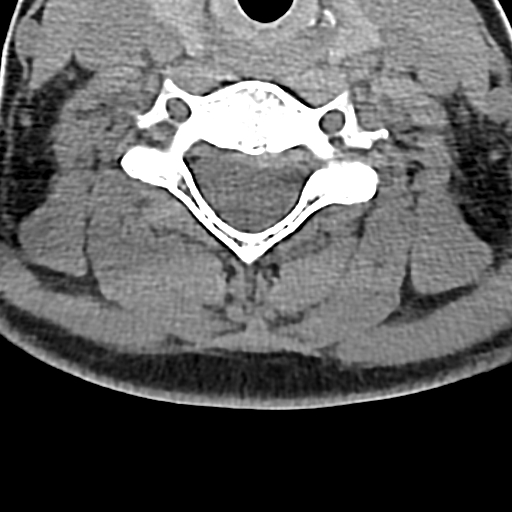
[im 14/70  bone]
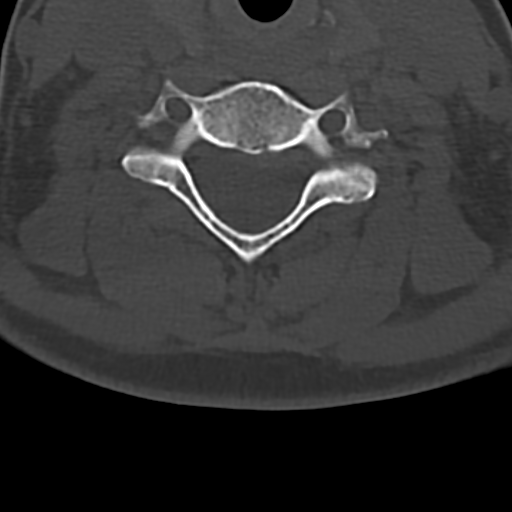
[im 28/70  bone]
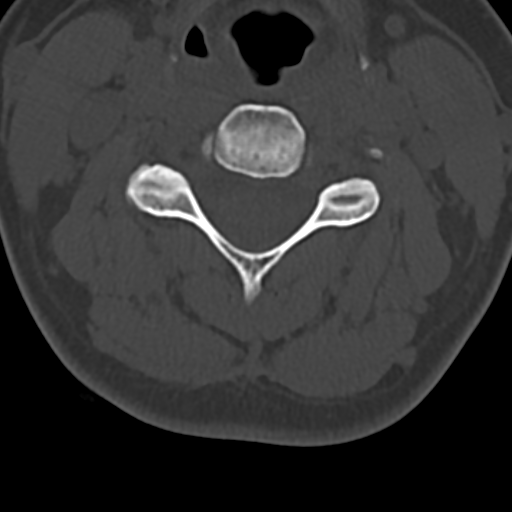
[im 42/70  bone]
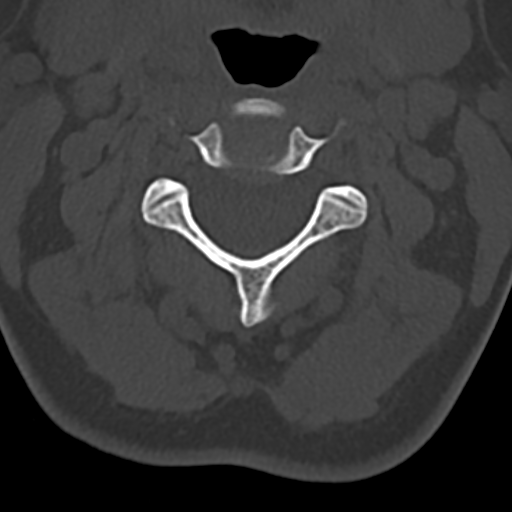
[im 56/70  bone]
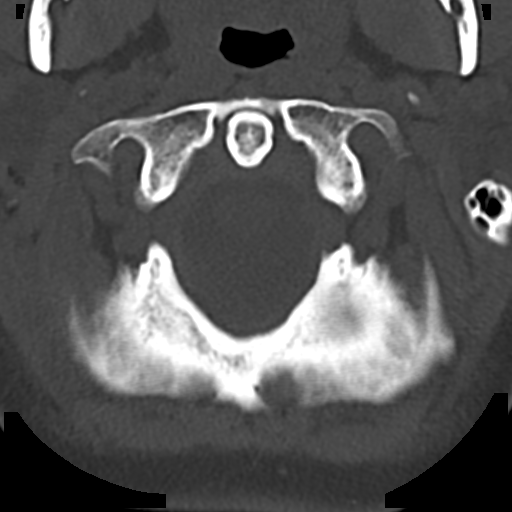

[16 of 33 positions shown; findings below may reference images not displayed]

FINDINGS: Alignment: Straightening of cervical lordosis without listhesis.

Skull base and vertebrae: No acute fracture. No primary bone lesion
or focal pathologic process.

Soft tissues and spinal canal: No prevertebral fluid or swelling. No
visible canal hematoma.

Disc levels: C5-6 discogenic degenerative changes with prominent
left-sided uncovertebral hypertrophy. Mild left-sided bony foraminal
stenosis. No significant canal stenosis. Upper thoracic spine facet
arthropathy partially visualized.

Upper chest: Negative.

Other: None.
IMPRESSION: 1. No acute fracture or dislocation of the cervical spine.
2. Mild discogenic degenerative changes at the C5-6 level with
prominent left-sided uncovertebral hypertrophy. Mild left C5-6 bony
foraminal stenosis. No significant canal stenosis.

By: Rtoyota Joshjax M.D.

## 2021-03-16 ENCOUNTER — Emergency Department (HOSPITAL_BASED_OUTPATIENT_CLINIC_OR_DEPARTMENT_OTHER)
Admission: EM | Admit: 2021-03-16 | Discharge: 2021-03-16 | Disposition: A | Discharge status: Home / Self Care | Payer: BLUE CROSS/BLUE SHIELD | Attending: Emergency Medicine | Admitting: Emergency Medicine

## 2021-03-16 ENCOUNTER — Emergency Department (HOSPITAL_BASED_OUTPATIENT_CLINIC_OR_DEPARTMENT_OTHER): Payer: BLUE CROSS/BLUE SHIELD

## 2021-03-16 ENCOUNTER — Other Ambulatory Visit: Payer: Self-pay

## 2021-03-16 ENCOUNTER — Encounter (HOSPITAL_BASED_OUTPATIENT_CLINIC_OR_DEPARTMENT_OTHER): Payer: Self-pay | Admitting: Emergency Medicine

## 2021-03-16 DIAGNOSIS — R350 Frequency of micturition: Secondary | ICD-10-CM | POA: Insufficient documentation

## 2021-03-16 DIAGNOSIS — R399 Unspecified symptoms and signs involving the genitourinary system: Secondary | ICD-10-CM

## 2021-03-16 DIAGNOSIS — Z20822 Contact with and (suspected) exposure to covid-19: Secondary | ICD-10-CM | POA: Insufficient documentation

## 2021-03-16 DIAGNOSIS — R300 Dysuria: Secondary | ICD-10-CM

## 2021-03-16 DIAGNOSIS — M545 Low back pain, unspecified: Secondary | ICD-10-CM

## 2021-03-16 DIAGNOSIS — R109 Unspecified abdominal pain: Secondary | ICD-10-CM | POA: Diagnosis not present

## 2021-03-16 LAB — PREGNANCY, URINE: Preg Test, Ur: NEGATIVE

## 2021-03-16 LAB — URINALYSIS, ROUTINE W REFLEX MICROSCOPIC
Bilirubin Urine: NEGATIVE
Glucose, UA: NEGATIVE mg/dL
Hgb urine dipstick: NEGATIVE
Ketones, ur: NEGATIVE mg/dL
Leukocytes,Ua: NEGATIVE
Nitrite: NEGATIVE
Protein, ur: NEGATIVE mg/dL
Specific Gravity, Urine: 1.021 (ref 1.005–1.030)
pH: 5 (ref 5.0–8.0)

## 2021-03-16 LAB — CBC WITH DIFFERENTIAL/PLATELET
Abs Immature Granulocytes: 0 10*3/uL (ref 0.00–0.07)
Basophils Absolute: 0 10*3/uL (ref 0.0–0.1)
Basophils Relative: 1 %
Eosinophils Absolute: 0.2 10*3/uL (ref 0.0–0.5)
Eosinophils Relative: 6 %
HCT: 36.9 % (ref 36.0–46.0)
Hemoglobin: 11.5 g/dL — ABNORMAL LOW (ref 12.0–15.0)
Immature Granulocytes: 0 %
Lymphocytes Relative: 38 %
Lymphs Abs: 1.3 10*3/uL (ref 0.7–4.0)
MCH: 23 pg — ABNORMAL LOW (ref 26.0–34.0)
MCHC: 31.2 g/dL (ref 30.0–36.0)
MCV: 73.7 fL — ABNORMAL LOW (ref 80.0–100.0)
Monocytes Absolute: 0.5 10*3/uL (ref 0.1–1.0)
Monocytes Relative: 15 %
Neutro Abs: 1.4 10*3/uL — ABNORMAL LOW (ref 1.7–7.7)
Neutrophils Relative %: 40 %
Platelets: 222 10*3/uL (ref 150–400)
RBC: 5.01 MIL/uL (ref 3.87–5.11)
RDW: 15.2 % (ref 11.5–15.5)
WBC: 3.5 10*3/uL — ABNORMAL LOW (ref 4.0–10.5)
nRBC: 0 % (ref 0.0–0.2)

## 2021-03-16 LAB — BASIC METABOLIC PANEL
Anion gap: 9 (ref 5–15)
BUN: 18 mg/dL (ref 6–20)
CO2: 27 mmol/L (ref 22–32)
Calcium: 9.1 mg/dL (ref 8.9–10.3)
Chloride: 103 mmol/L (ref 98–111)
Creatinine, Ser: 0.76 mg/dL (ref 0.44–1.00)
GFR, Estimated: >60 mL/min (ref >60)
Glucose, Bld: 131 mg/dL — ABNORMAL HIGH (ref 70–99)
Potassium: 4 mmol/L (ref 3.5–5.1)
Sodium: 139 mmol/L (ref 135–145)

## 2021-03-16 LAB — RESP PANEL BY RT-PCR (FLU A&B, COVID) ARPGX2
Influenza A by PCR: NEGATIVE
Influenza B by PCR: NEGATIVE
SARS Coronavirus 2 by RT PCR: NEGATIVE

## 2021-03-16 LAB — SEDIMENTATION RATE: Sed Rate: 19 mm/hr (ref 0–22)

## 2021-03-16 LAB — C-REACTIVE PROTEIN: CRP: 0.7 mg/dL (ref <1.0)

## 2021-03-16 MED ORDER — IBUPROFEN 800 MG PO TABS: 800.0000 mg | ORAL_TABLET | Freq: Three times a day (TID) | ORAL | 0 refills | Status: AC

## 2021-03-16 MED ORDER — LIDOCAINE 5 % EX PTCH: 1.0000 | MEDICATED_PATCH | CUTANEOUS | 0 refills | Status: AC

## 2021-03-16 MED ORDER — LIDOCAINE 5 % EX PTCH
2.0000 | MEDICATED_PATCH | CUTANEOUS | Status: DC
  Administered 2021-03-16: 2 via TRANSDERMAL
  Filled 2021-03-16 (×2): qty 2

## 2021-03-16 MED ORDER — IBUPROFEN 800 MG PO TABS
800.0000 mg | ORAL_TABLET | Freq: Once | ORAL | Status: AC
  Administered 2021-03-16: 800 mg via ORAL
  Filled 2021-03-16: qty 1

## 2021-03-16 MED ORDER — CYCLOBENZAPRINE HCL 10 MG PO TABS: 10.0000 mg | ORAL_TABLET | Freq: Two times a day (BID) | ORAL | 0 refills | Status: AC | PRN

## 2021-03-16 NOTE — ED Triage Notes (Signed)
Pt reports pressure in her bladder for 2-3 days. Also having lower back pain, attempted stretching with no relief.

## 2021-03-16 NOTE — Discharge Instructions (Addendum)
You were evaluated in the Emergency Department and after careful evaluation, we did not find any emergent condition requiring admission or further testing in the hospital.  Your exam/testing today was overall reassuring.  Your CT scan was negative, your urinalysis was negative for blood in your urine or evidence of urinary tract infection, and your laboratory work-up was reassuring.  Your symptoms could be due to musculoskeletal strain in your lower back or interstitial cystitis.  Recommend you follow-up with urology outpatient in clinic for repeat assessment and further evaluation.  You have no red flag symptoms at this time to suggest a need for further imaging of your lower back.  Please return to the Emergency Department if you experience any worsening of your condition.  Thank you for allowing [place] to be a part of your care.

## 2021-03-16 NOTE — ED Provider Notes (Signed)
[facility] EMERGENCY DEPT Provider Note   CSN: [id number] Arrival date & time: 03/16/21  8299     History  Chief Complaint  Patient presents with   Back Pain    Carmen Burnett is a 51 y.o. female.  HPI  50 year old female presenting to the emergency department with pressure in her bladder and pain in her back.  Pressure in her bladder has been present for the last 2 to 3 days.  She states that the back pain was bilateral and a cramping sensation to the point where it has been hurting to get up and out of bed.  The symptoms have been going on for the past week.  She does note increased urinary frequency, getting up around 4 times in the middle of the night last night to urinate.  She endorses pain after urinating.  She has tried managing her back pain symptoms at home with Tylenol with no relief.  She denies any numbness, weakness in the lower extremities, saddle anesthesia, fever or chills, bowel or bladder incontinence.  She denies any traumatic injury to her back that she can think of.  She denies any radicular pain or symptoms.  Pain is located in her back bilaterally.  Home Medications Prior to Admission medications   Medication Sig Start Date End Date Taking? Authorizing Provider  cyclobenzaprine (FLEXERIL) 10 MG tablet Take 1 tablet (10 mg total) by mouth 2 (two) times daily as needed for muscle spasms. 03/16/21  Yes Regan Lemming, MD  ibuprofen (ADVIL) 800 MG tablet Take 1 tablet (800 mg total) by mouth 3 (three) times daily. 03/16/21  Yes Regan Lemming, MD  lidocaine (LIDODERM) 5 % Place 1 patch onto the skin daily. Remove & Discard patch within 12 hours or as directed by MD 03/16/21  Yes Regan Lemming, MD  diclofenac (VOLTAREN) 75 MG EC tablet Take 75 mg by mouth 2 (two) times daily. 08/28/17   [provider]  fluticasone (FLONASE) 50 MCG/ACT nasal spray Place 1 spray into both nostrils daily. Patient not taking: Reported on 09/20/2017 03/17/15    Tomi Likens, PA-C  methylPREDNISolone (MEDROL DOSEPAK) 4 MG TBPK tablet As directed 09/20/17   Ezequiel Essex, MD  oseltamivir (TAMIFLU) 75 MG capsule Take 1 capsule (75 mg total) by mouth every 12 (twelve) hours. Patient not taking: Reported on 09/20/2017 03/17/15   Tomi Likens, PA-C      Allergies    Patient has no known allergies.    Review of Systems   Review of Systems  Genitourinary:  Positive for dysuria, flank pain and frequency.  All other systems reviewed and are negative.  Physical Exam Updated Vital Signs BP (!) 131/92    Pulse 98    Temp 98.4 F (36.9 C) (Oral)    Resp 18    Ht _0  (1.6 m)    Wt 73.5 kg    SpO2 100%    BMI 28.70 kg/m  Physical Exam Vitals and nursing note reviewed.  Constitutional:      General: She is not in acute distress. HENT:     Head: Normocephalic and atraumatic.  Eyes:     Conjunctiva/sclera: Conjunctivae normal.     Pupils: Pupils are equal, round, and reactive to light.  Cardiovascular:     Rate and Rhythm: Normal rate and regular rhythm.  Pulmonary:     Effort: Pulmonary effort is normal. No respiratory distress.  Abdominal:     General: There is no distension.  Tenderness: There is no abdominal tenderness. There is right CVA tenderness and left CVA tenderness. There is no guarding or rebound.  Musculoskeletal:        General: No deformity or signs of injury.     Cervical back: Neck supple.  Skin:    Findings: No lesion or rash.  Neurological:     General: No focal deficit present.     Mental Status: She is alert. Mental status is at baseline.     Cranial Nerves: No cranial nerve deficit.     Sensory: No sensory deficit.     Motor: No weakness.    ED Results / Procedures / Treatments   Labs (all labs ordered are listed, but only abnormal results are displayed) Labs Reviewed  CBC WITH DIFFERENTIAL/PLATELET - Abnormal; Notable for the following components:      Result Value   WBC 3.5 (*)    Hemoglobin 11.5  (*)    MCV 73.7 (*)    MCH 23.0 (*)    Neutro Abs 1.4 (*)    All other components within normal limits  BASIC METABOLIC PANEL - Abnormal; Notable for the following components:   Glucose, Bld 131 (*)    All other components within normal limits  RESP PANEL BY RT-PCR (FLU A&B, COVID) [id number]  URINALYSIS, ROUTINE W REFLEX MICROSCOPIC  PREGNANCY, URINE  SEDIMENTATION RATE  C-REACTIVE PROTEIN    EKG None  Radiology CT RENAL STONE STUDY  Result Date: 03/16/2021 CLINICAL DATA:  Flank pain.  Evaluate for kidney stone. EXAM: CT ABDOMEN AND PELVIS WITHOUT CONTRAST TECHNIQUE: Multidetector CT imaging of the abdomen and pelvis was performed following the standard protocol without IV contrast. RADIATION DOSE REDUCTION: This exam was performed according to the departmental dose-optimization program which includes automated exposure control, adjustment of the mA and/or kV according to patient size and/or use of iterative reconstruction technique. COMPARISON:  None. FINDINGS: Lower chest: No acute abnormality. Hepatobiliary: No focal liver abnormality is seen. No gallstones, gallbladder wall thickening, or biliary dilatation. Pancreas: Unremarkable. No pancreatic ductal dilatation or surrounding inflammatory changes. Spleen: Normal in size without focal abnormality. Adrenals/Urinary Tract: Normal adrenal glands. No nephrolithiasis or mass identified bilaterally. No hydronephrosis, hydroureter, or ureteral lithiasis. Urinary bladder appears unremarkable. Stomach/Bowel: Stomach is within normal limits. Appendix appears normal. No evidence of bowel wall thickening, distention, or inflammatory changes. Vascular/Lymphatic: No significant vascular findings are present. No enlarged abdominal or pelvic lymph nodes. Reproductive: Status post hysterectomy.  No adnexal mass identified. Other: No free fluid or fluid collections Musculoskeletal: No acute or significant osseous findings. IMPRESSION: No acute findings within  the abdomen or pelvis. Electronically Signed   By: Kerby Moors M.D.   On: 03/16/2021 09:55    Procedures Procedures    Medications Ordered in ED Medications  lidocaine (LIDODERM) 5 % 2 patch (2 patches Transdermal Patch Applied 03/16/21 0946)  ibuprofen (ADVIL) tablet 800 mg (800 mg Oral Given 03/16/21 0945)    ED Course/ Medical Decision Making/ A&P                           Medical Decision Making Amount and/or Complexity of Data Reviewed Labs: ordered. Radiology: ordered.  Risk Prescription drug management.   50 year old female presenting to the emergency department with pressure in her bladder and pain in her back.  Pressure in her bladder has been present for the last 2 to 3 days.  She states that the back pain was bilateral and  a cramping sensation to the point where it has been hurting to get up and out of bed.  The symptoms have been going on for the past week.  She does note increased urinary frequency, getting up around 4 times in the middle of the night last night to urinate.  She endorses pain after urinating.  She has tried managing her back pain symptoms at home with Tylenol with no relief.  She denies any numbness, weakness in the lower extremities, saddle anesthesia, fever or chills, bowel or bladder incontinence.  She denies any traumatic injury to her back that she can think of.  She denies any radicular pain or symptoms.  Pain is located in her back bilaterally.  Differential diagnosis includes urinary tract infection/pyelonephritis, nephrolithiasis, musculoskeletal injury, less likely SBO, appendicitis, diverticulitis.  Low concern for cauda equina with no red flag symptoms and a normal neurologic exam.  Work-up initiated to include urinalysis and urine culture.  Urinalysis negative for UTI, urine pregnancy negative, ESR normal at 19, BMP unremarkable with no evidence of AKI, no electrolyte abnormality.  COVID-19 and influenza PCR testing resulted negative, CBC  without a leukocytosis, leukopenia present 3.5, stable anemia to 11.5.  The patient was administered 2 lidocaine patches bilaterally in her lower back and administered ibuprofen 800 mg for pain control.  No concern for infectious etiology at this time.  Symptoms could be consistent with possible interstitial cystitis versus musculoskeletal strain.  A gold card referral was placed for follow-up with urology given the patient's sensation of increased urinary frequency, dysuria and sensation of bladder discomfort and fullness.  Recommended NSAIDs for pain control outpatient, Flexeril for muscle spasms.  Stable for discharge at this time.  Final Clinical Impression(s) / ED Diagnoses Final diagnoses:  Urinary symptom or sign  Increased frequency of urination  Dysuria  Acute bilateral low back pain without sciatica    Rx / DC Orders ED Discharge Orders          Ordered    lidocaine (LIDODERM) 5 %  Every 24 hours        03/16/21 1151    ibuprofen (ADVIL) 800 MG tablet  3 times daily        03/16/21 1151    cyclobenzaprine (FLEXERIL) 10 MG tablet  2 times daily PRN        03/16/21 1151              Regan Lemming, MD 03/16/21 1445

## 2021-03-18 ENCOUNTER — Other Ambulatory Visit: Payer: Self-pay

## 2021-03-18 ENCOUNTER — Encounter (HOSPITAL_BASED_OUTPATIENT_CLINIC_OR_DEPARTMENT_OTHER): Payer: Self-pay

## 2021-03-18 ENCOUNTER — Emergency Department (HOSPITAL_BASED_OUTPATIENT_CLINIC_OR_DEPARTMENT_OTHER)
Admission: EM | Admit: 2021-03-18 | Discharge: 2021-03-18 | Disposition: A | Discharge status: Home / Self Care | Payer: BLUE CROSS/BLUE SHIELD | Attending: Emergency Medicine | Admitting: Emergency Medicine

## 2021-03-18 DIAGNOSIS — N3090 Cystitis, unspecified without hematuria: Secondary | ICD-10-CM | POA: Diagnosis not present

## 2021-03-18 DIAGNOSIS — R350 Frequency of micturition: Secondary | ICD-10-CM | POA: Diagnosis present

## 2021-03-18 LAB — URINALYSIS, ROUTINE W REFLEX MICROSCOPIC
Bilirubin Urine: NEGATIVE
Glucose, UA: NEGATIVE mg/dL
Ketones, ur: NEGATIVE mg/dL
Nitrite: NEGATIVE
Protein, ur: 30 mg/dL — AB
RBC / HPF: >50 RBC/hpf — ABNORMAL HIGH (ref 0–5)
Specific Gravity, Urine: 1.018 (ref 1.005–1.030)
WBC, UA: >50 WBC/hpf — ABNORMAL HIGH (ref 0–5)
pH: 5.5 (ref 5.0–8.0)

## 2021-03-18 MED ORDER — PHENAZOPYRIDINE HCL 100 MG PO TABS
200.0000 mg | ORAL_TABLET | Freq: Once | ORAL | Status: AC
  Administered 2021-03-18: 200 mg via ORAL
  Filled 2021-03-18: qty 2

## 2021-03-18 MED ORDER — CEPHALEXIN 250 MG PO CAPS
500.0000 mg | ORAL_CAPSULE | Freq: Once | ORAL | Status: AC
  Administered 2021-03-18: 500 mg via ORAL
  Filled 2021-03-18: qty 2

## 2021-03-18 MED ORDER — CEPHALEXIN 500 MG PO CAPS: 500.0000 mg | ORAL_CAPSULE | Freq: Two times a day (BID) | ORAL | 0 refills | Status: AC

## 2021-03-18 MED ORDER — KETOROLAC TROMETHAMINE 30 MG/ML IJ SOLN
30.0000 mg | Freq: Once | INTRAMUSCULAR | Status: AC
  Administered 2021-03-18: 30 mg via INTRAMUSCULAR
  Filled 2021-03-18: qty 1

## 2021-03-18 MED ORDER — PHENAZOPYRIDINE HCL 200 MG PO TABS: 200.0000 mg | ORAL_TABLET | Freq: Three times a day (TID) | ORAL | 0 refills | Status: AC | PRN

## 2021-03-18 NOTE — ED Triage Notes (Signed)
Increased urgency to urinate with painful urination for 7 days.  Was seen here this past Sunday for same symptoms.

## 2021-03-18 NOTE — ED Provider Notes (Signed)
[facility] Provider Note: Georgena Spurling, MD, FACEP  CSN: [id number] MRN: [id number] ARRIVAL: 03/18/21 at [place]: [id number]   CHIEF COMPLAINT  Urinary Frequency   HISTORY OF PRESENT ILLNESS  03/18/21 5:15 AM Carmen Burnett is a 51 y.o. female who was seen in the ED 2 days ago for several days of pressure in her bladder and pain in her back.  She also noted increased urinary frequency, especially at night.  She was also having pain after urinating.  She was not able to control her back pain with Tylenol.  Work-up included a urinalysis which was normal.  CBC is showed a mild microcytic anemia with a hemoglobin 11.5.  Bement was normal except for glucose of 131.  C-reactive protein and sed rate were normal.  She was negative for COVID and influenza.  A CT stone study was unremarkable.  She was discharged with Flexeril and lidocaine patches and referred to urology.  She returns with increased suprapubic pain, urinary urgency and urinary frequency.  She has not voiding small amounts.  She is also having exacerbation of her suprapubic pain after urination.  She states the symptoms are like previous urinary tract infections.  She rates her discomfort as a 9 out of 10, aching in nature.  She denies fever or chills.  She has had no nausea, vomiting or diarrhea.  Her back pain has improved significantly.   History reviewed. No pertinent past medical history.  Past Surgical History:  Procedure Laterality Date   ABDOMINAL HYSTERECTOMY      No family history on file.  Social History   Tobacco Use   Smoking status: Never   Smokeless tobacco: Never  Substance Use Topics   Alcohol use: Yes   Drug use: Not Currently    Prior to Admission medications   Medication Sig Start Date End Date Taking? Authorizing Provider  cephALEXin (KEFLEX) 500 MG capsule Take 1 capsule (500 mg total) by mouth 2 (two) times daily. 03/18/21  Yes Daric Koren, MD  phenazopyridine (PYRIDIUM) 200 MG  tablet Take 1 tablet (200 mg total) by mouth 3 (three) times daily as needed for pain. 03/18/21  Yes Evann Erazo, MD  cyclobenzaprine (FLEXERIL) 10 MG tablet Take 1 tablet (10 mg total) by mouth 2 (two) times daily as needed for muscle spasms. 03/16/21   Regan Lemming, MD  diclofenac (VOLTAREN) 75 MG EC tablet Take 75 mg by mouth 2 (two) times daily. 08/28/17   [provider]  ibuprofen (ADVIL) 800 MG tablet Take 1 tablet (800 mg total) by mouth 3 (three) times daily. 03/16/21   Regan Lemming, MD  lidocaine (LIDODERM) 5 % Place 1 patch onto the skin daily. Remove & Discard patch within 12 hours or as directed by MD 03/16/21   Regan Lemming, MD    Allergies Patient has no known allergies.   REVIEW OF SYSTEMS  Negative except as noted here or in the History of Present Illness.   PHYSICAL EXAMINATION  Initial Vital Signs Blood pressure (!) 172/101, pulse 78, temperature 97.7 F (36.5 C), temperature source Oral, resp. rate 16, height 5\' 3"  (1.6 m), weight 73.5 kg, SpO2 100 %.  Examination General: Well-developed, well-nourished female in no acute distress; appearance consistent with age of record HENT: normocephalic; atraumatic Eyes: Normal appearance Neck: supple Heart: regular rate and rhythm Lungs: clear to auscultation bilaterally Abdomen: soft; nondistended; nontender; bowel sounds present Extremities: No deformity; full range of motion; pulses normal Neurologic: Awake, alert and oriented; motor function intact  in all extremities and symmetric; no facial droop Skin: Warm and dry Psychiatric: Normal mood and affect   RESULTS  Summary of this visit's results, reviewed and interpreted by myself:   EKG Interpretation  Date/Time:    Ventricular Rate:    PR Interval:    QRS Duration:   QT Interval:    QTC Calculation:   R Axis:     Text Interpretation:         Laboratory Studies: Results for orders placed or performed during the hospital encounter of 03/18/21  (from the past 24 hour(s))  Urinalysis, Routine w reflex microscopic Urine, Clean Catch     Status: Abnormal   Collection Time: 03/18/21  5:22 AM  Result Value Ref Range   Color, Urine YELLOW YELLOW   APPearance CLOUDY (A) CLEAR   Specific Gravity, Urine 1.018 1.005 - 1.030   pH 5.5 5.0 - 8.0   Glucose, UA NEGATIVE NEGATIVE mg/dL   Hgb urine dipstick LARGE (A) NEGATIVE   Bilirubin Urine NEGATIVE NEGATIVE   Ketones, ur NEGATIVE NEGATIVE mg/dL   Protein, ur 30 (A) NEGATIVE mg/dL   Nitrite NEGATIVE NEGATIVE   Leukocytes,Ua LARGE (A) NEGATIVE   RBC / HPF >50 (H) 0 - 5 RBC/hpf   WBC, UA >50 (H) 0 - 5 WBC/hpf   Bacteria, UA RARE (A) NONE SEEN   Squamous Epithelial / LPF 0-5 0 - 5   WBC Clumps PRESENT    Imaging Studies: CT RENAL STONE STUDY  Result Date: 03/16/2021 CLINICAL DATA:  Flank pain.  Evaluate for kidney stone. EXAM: CT ABDOMEN AND PELVIS WITHOUT CONTRAST TECHNIQUE: Multidetector CT imaging of the abdomen and pelvis was performed following the standard protocol without IV contrast. RADIATION DOSE REDUCTION: This exam was performed according to the departmental dose-optimization program which includes automated exposure control, adjustment of the mA and/or kV according to patient size and/or use of iterative reconstruction technique. COMPARISON:  None. FINDINGS: Lower chest: No acute abnormality. Hepatobiliary: No focal liver abnormality is seen. No gallstones, gallbladder wall thickening, or biliary dilatation. Pancreas: Unremarkable. No pancreatic ductal dilatation or surrounding inflammatory changes. Spleen: Normal in size without focal abnormality. Adrenals/Urinary Tract: Normal adrenal glands. No nephrolithiasis or mass identified bilaterally. No hydronephrosis, hydroureter, or ureteral lithiasis. Urinary bladder appears unremarkable. Stomach/Bowel: Stomach is within normal limits. Appendix appears normal. No evidence of bowel wall thickening, distention, or inflammatory changes.  Vascular/Lymphatic: No significant vascular findings are present. No enlarged abdominal or pelvic lymph nodes. Reproductive: Status post hysterectomy.  No adnexal mass identified. Other: No free fluid or fluid collections Musculoskeletal: No acute or significant osseous findings. IMPRESSION: No acute findings within the abdomen or pelvis. Electronically Signed   By: Kerby Moors M.D.   On: 03/16/2021 09:55    ED COURSE and MDM  Nursing notes, initial and subsequent vitals signs, including pulse oximetry, reviewed and interpreted by myself.  Vitals:   03/18/21 0513 03/18/21 0515  BP: (!) 172/101   Pulse: 78   Resp: 16   Temp: 97.7 F (36.5 C)   TempSrc: Oral   SpO2: 100%   Weight:  73.5 kg  Height:  5\' 3"  (1.6 m)   Medications  ketorolac (TORADOL) 30 MG/ML injection 30 mg (has no administration in time range)  phenazopyridine (PYRIDIUM) tablet 200 mg (has no administration in time range)  cephALEXin (KEFLEX) capsule 500 mg (has no administration in time range)   Urinalysis now consistent with a urinary tract infection with hematuria, likely hemorrhagic cystitis.  We will  start treatment for urinary tract infection.  Urine sent for culture.  Patient also requesting a shot of Toradol for her back discomfort.  PROCEDURES  Procedures   ED DIAGNOSES     ICD-10-CM   1. Cystitis  N30.90          Wes Lezotte, Jenny Reichmann, MD 03/18/21 [phone]

## 2021-03-20 LAB — URINE CULTURE: Culture: >=100000 — AB

## 2021-03-21 ENCOUNTER — Telehealth: Payer: Self-pay | Admitting: Emergency Medicine

## 2021-03-21 NOTE — Telephone Encounter (Signed)
Post ED Visit - Positive Culture Follow-up: Successful Patient Follow-Up  Culture assessed and recommendations reviewed by:  []  , Pharm.D. []  Enzo Bi, Pharm.D., BCPS AQ-ID []  , Pharm.D., BCPS []  Celedonio Miyamoto, Pharm.D., BCPS []  [place], Garvin Fila.D., BCPS, AAHIVP []  , Pharm.D., BCPS, AAHIVP []  Georgina Pillion, PharmD, BCPS []  , PharmD, BCPS []  [place], PharmD, BCPS []  [address], PharmD  Positive urine culture  []  Patient discharged without antimicrobial prescription and treatment is now indicated [x]  Organism is resistant to prescribed ED discharge antimicrobial []  Patient with positive blood cultures  Changes discussed with ED provider: [facility] New antibiotic prescription stop keflex, start cefdinir 300mg  po bid x 7 days  Attempting to contact patient   03/21/2021, 12:46 PM

## 2021-03-21 NOTE — Progress Notes (Signed)
ED Antimicrobial Stewardship Positive Culture Follow Up   Carmen Burnett is an 51 y.o. female who presented to [facility] on 03/18/2021 with a chief complaint of  Chief Complaint  Patient presents with   Urinary Frequency    Recent Results (from the past 720 hour(s))  Resp Panel by RT-PCR (Flu A&B, Covid) Nasopharyngeal Swab     Status: None   Collection Time: 03/16/21 10:29 AM   Specimen: Nasopharyngeal Swab; Nasopharyngeal(NP) swabs in vial transport medium  Result Value Ref Range Status   SARS Coronavirus 2 by RT PCR NEGATIVE NEGATIVE Final    Comment: (NOTE) SARS-CoV-2 target nucleic acids are NOT DETECTED.  The SARS-CoV-2 RNA is generally detectable in upper respiratory specimens during the acute phase of infection. The lowest concentration of SARS-CoV-2 viral copies this assay can detect is 138 copies/mL. A negative result does not preclude SARS-Cov-2 infection and should not be used as the sole basis for treatment or other patient management decisions. A negative result may occur with  improper specimen collection/handling, submission of specimen other than nasopharyngeal swab, presence of viral mutation(s) within the areas targeted by this assay, and inadequate number of viral copies(<138 copies/mL). A negative result must be combined with clinical observations, patient history, and epidemiological information. The expected result is Negative.  Fact Sheet for Patients:  BloggerCourse.com  Fact Sheet for Healthcare Providers:  SeriousBroker.it  This test is no t yet approved or cleared by the [place] FDA and  has been authorized for detection and/or diagnosis of SARS-CoV-2 by FDA under an Emergency Use Authorization (EUA). This EUA will remain  in effect (meaning this test can be used) for the duration of the COVID-19 declaration under Section 564(b)(1) of the Act, 21 U.S.C.section 360bbb-3(b)(1), unless the  authorization is terminated  or revoked sooner.       Influenza A by PCR NEGATIVE NEGATIVE Final   Influenza B by PCR NEGATIVE NEGATIVE Final    Comment: (NOTE) The Xpert Xpress SARS-CoV-2/FLU/RSV plus assay is intended as an aid in the diagnosis of influenza from Nasopharyngeal swab specimens and should not be used as a sole basis for treatment. Nasal washings and aspirates are unacceptable for Xpert Xpress SARS-CoV-2/FLU/RSV testing.  Fact Sheet for Patients: BloggerCourse.com  Fact Sheet for Healthcare Providers: SeriousBroker.it  This test is not yet approved or cleared by the [place] FDA and has been authorized for detection and/or diagnosis of SARS-CoV-2 by FDA under an Emergency Use Authorization (EUA). This EUA will remain in effect (meaning this test can be used) for the duration of the COVID-19 declaration under Section 564(b)(1) of the Act, 21 U.S.C. section 360bbb-3(b)(1), unless the authorization is terminated or revoked.  Performed at [facility], [address] [place], [place] [address]   Urine Culture     Status: Abnormal   Collection Time: 03/18/21  5:22 AM   Specimen: Urine, Clean Catch  Result Value Ref Range Status   Specimen Description   Final    URINE, CLEAN CATCH Performed at [facility], [address], [place], [place] [address]    Special Requests   Final    NONE Performed at [facility], [address] [place], [place] [address]    Culture >=100,000 COLONIES/mL ENTEROBACTER AEROGENES (A)  Final   Report Status 03/20/2021 FINAL  Final   Organism ID, Bacteria ENTEROBACTER AEROGENES (A)  Final      Susceptibility   Enterobacter aerogenes - MIC*    CEFAZOLIN RESISTANT Resistant  CEFEPIME <=0.12 SENSITIVE Sensitive     CEFTRIAXONE <=0.25 SENSITIVE Sensitive     CIPROFLOXACIN <=0.25 SENSITIVE Sensitive     GENTAMICIN <=1  SENSITIVE Sensitive     IMIPENEM <=0.25 SENSITIVE Sensitive     NITROFURANTOIN 64 INTERMEDIATE Intermediate     TRIMETH/SULFA <=20 SENSITIVE Sensitive     PIP/TAZO <=4 SENSITIVE Sensitive     * >=100,000 COLONIES/mL ENTEROBACTER AEROGENES    [x]  Treated with Keflex, organism resistant to prescribed antimicrobial []  Patient discharged originally without antimicrobial agent and treatment is now indicated  New antibiotic prescription: Stop keflex and start cefdinir 300mg  by mouth twice daily for 7 days.   ED Provider: , PA-C   03/21/2021, 9:50 AM Clinical Pharmacist Monday - Friday phone -  [phone] Saturday - Sunday phone - [phone]

## 2023-09-27 IMAGING — CT CT RENAL STONE PROTOCOL
2 of 4 series · 16 of 46 positions shown, 18 images · non-contrast
Comparison: None.

CLINICAL DATA: Flank pain.  Evaluate for kidney stone.



[Series 2: stone full · axial · 0.81mm/px · z∈[+796,+1196]mm · 13 of 91 slices shown, 15 images]
[im 7/91  soft-tissue]
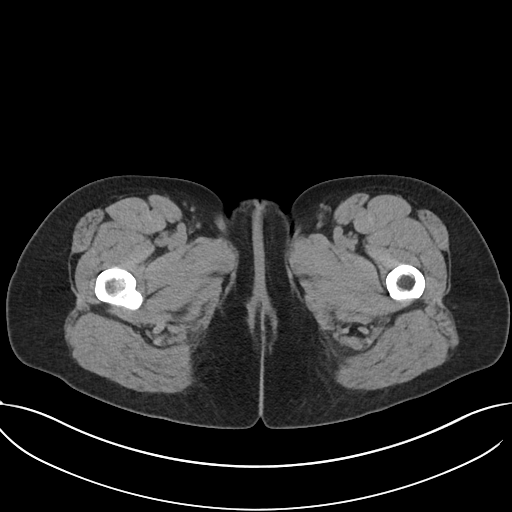
[im 7/91  bone]
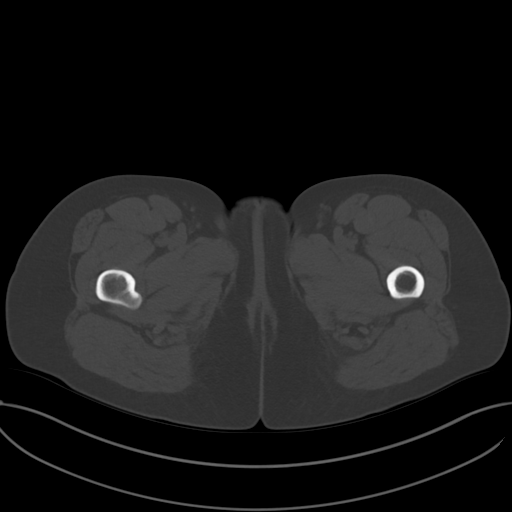
[im 14/91  soft-tissue]
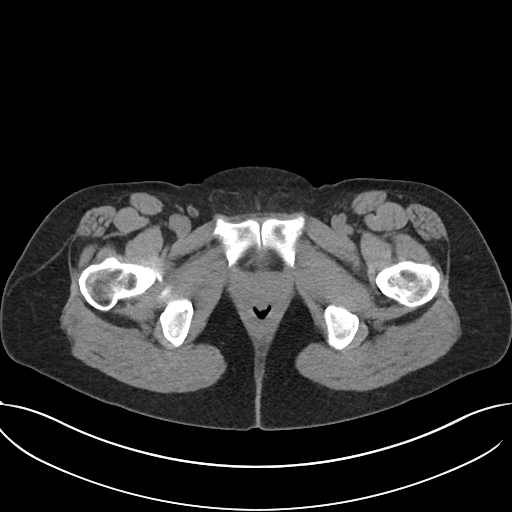
[im 21/91  soft-tissue]
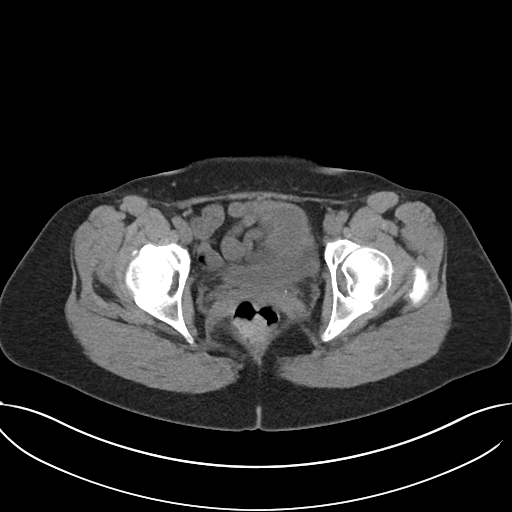
[im 27/91  soft-tissue]
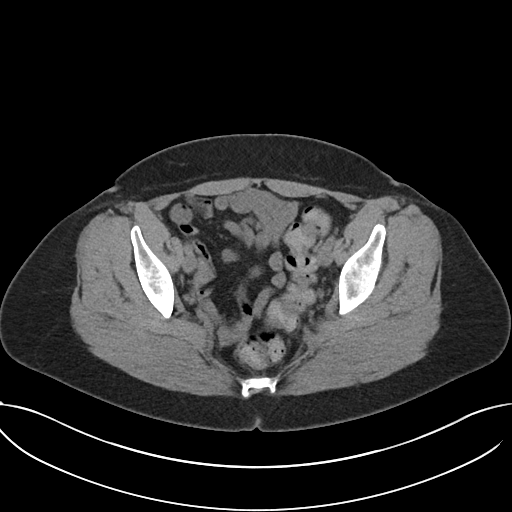
[im 34/91  soft-tissue]
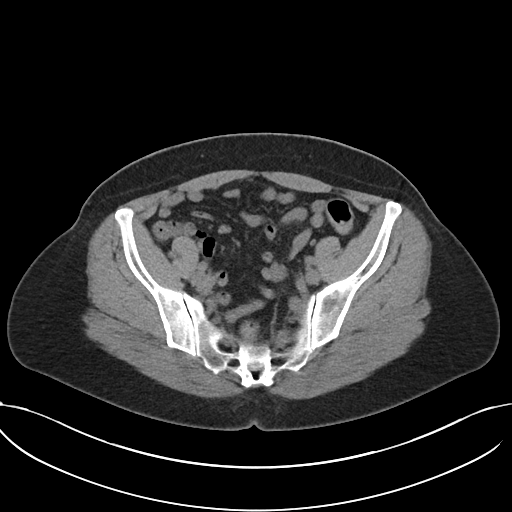
[im 41/91  soft-tissue]
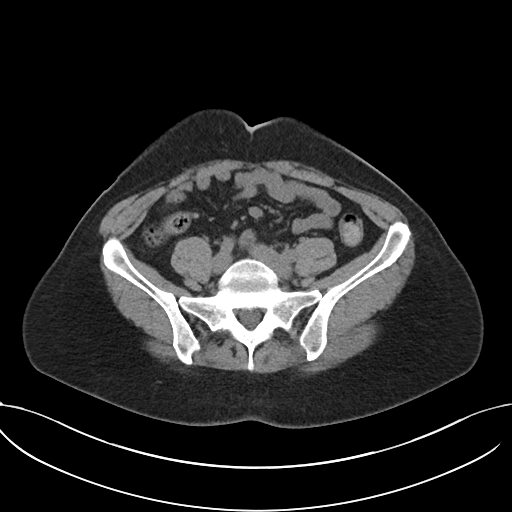
[im 47/91  soft-tissue]
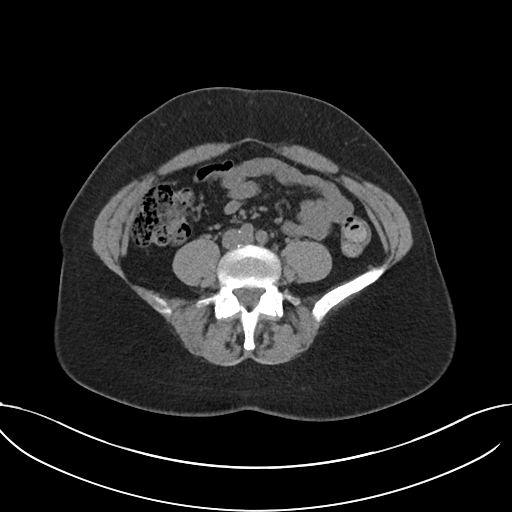
[im 54/91  soft-tissue]
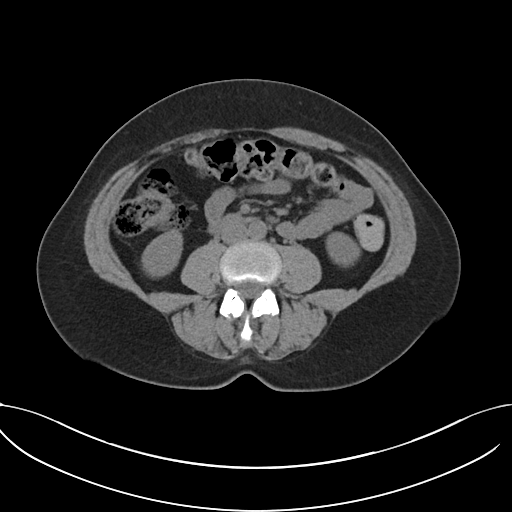
[im 61/91  soft-tissue]
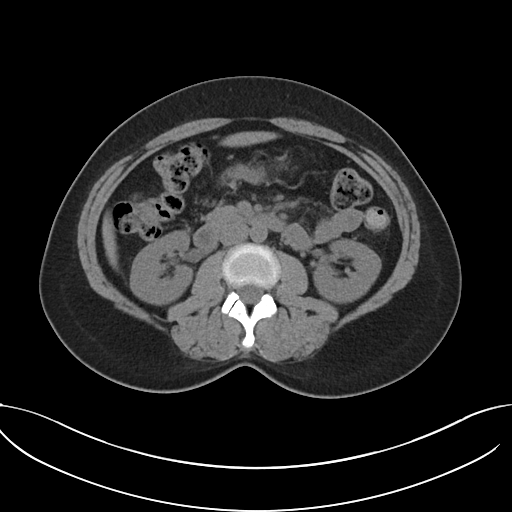
[im 61/91  bone]
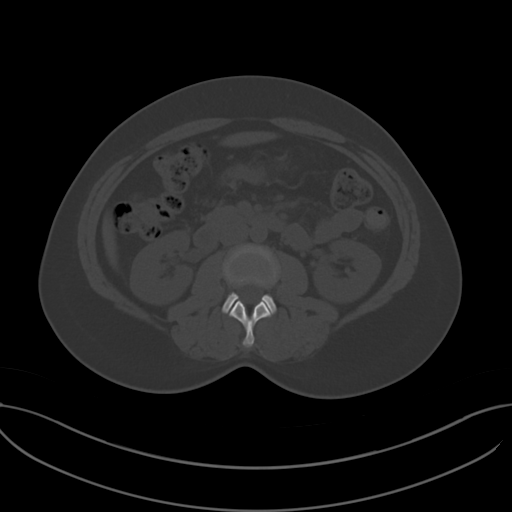
[im 67/91  soft-tissue]
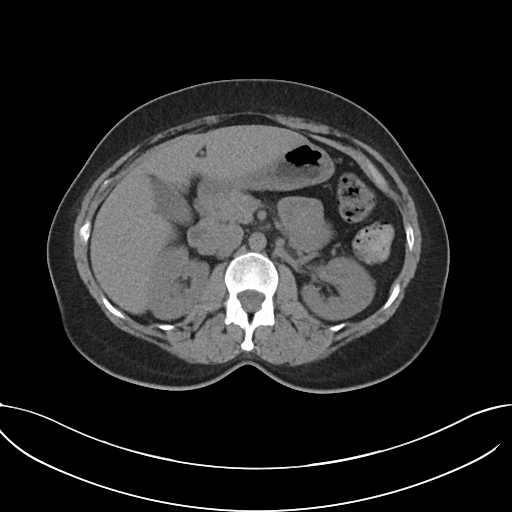
[im 74/91  soft-tissue]
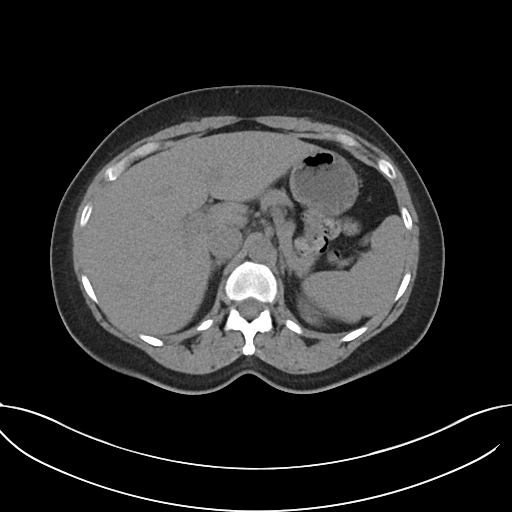
[im 81/91  soft-tissue]
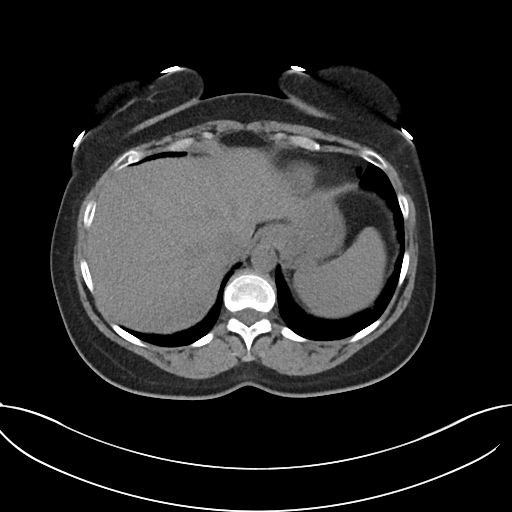
[im 87/91  soft-tissue]
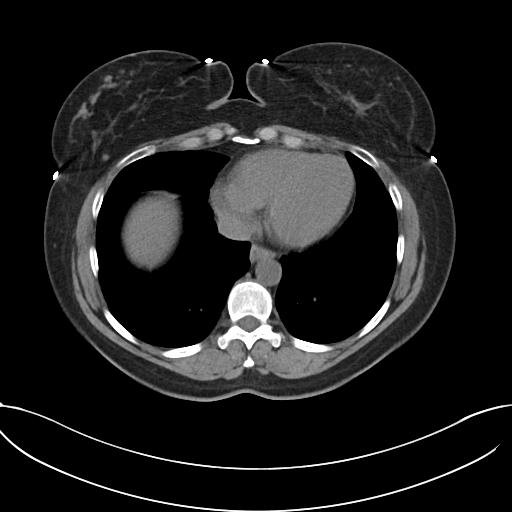

[Series 5: coronal · coronal · 0.77mm/px · 3 of 99 slices shown]
[im 33/99  soft-tissue]
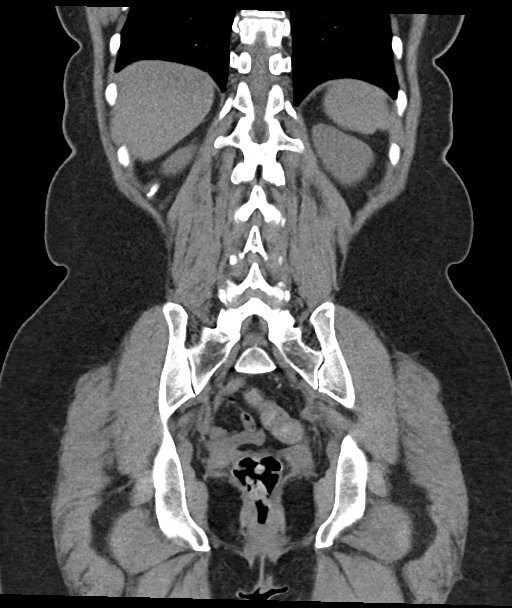
[im 44/99  soft-tissue]
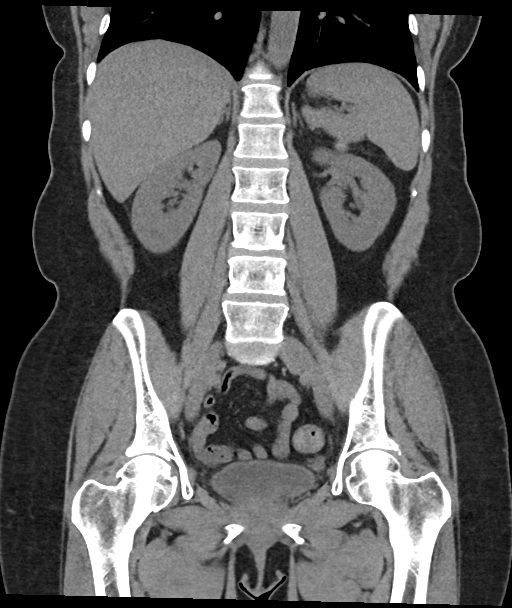
[im 55/99  soft-tissue]
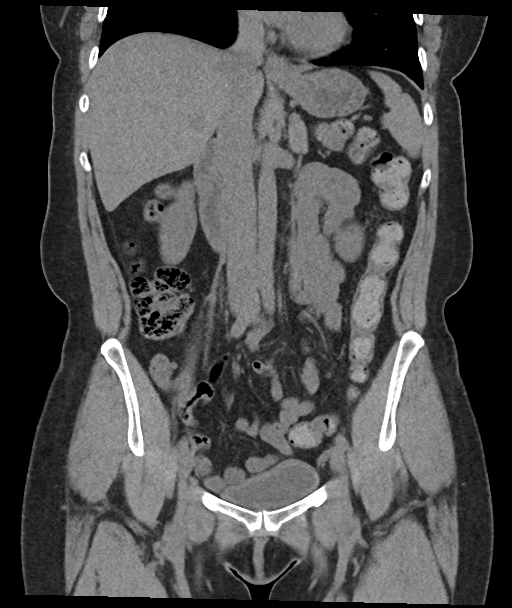

[16 of 46 positions shown; findings below may reference images not displayed]

FINDINGS: Lower chest: No acute abnormality.

Hepatobiliary: No focal liver abnormality is seen. No gallstones,
gallbladder wall thickening, or biliary dilatation.

Pancreas: Unremarkable. No pancreatic ductal dilatation or
surrounding inflammatory changes.

Spleen: Normal in size without focal abnormality.

Adrenals/Urinary Tract: Normal adrenal glands. No nephrolithiasis or
mass identified bilaterally. No hydronephrosis, hydroureter, or
ureteral lithiasis. Urinary bladder appears unremarkable.

Stomach/Bowel: Stomach is within normal limits. Appendix appears
normal. No evidence of bowel wall thickening, distention, or
inflammatory changes.

Vascular/Lymphatic: No significant vascular findings are present. No
enlarged abdominal or pelvic lymph nodes.

Reproductive: Status post hysterectomy.  No adnexal mass identified.

Other: No free fluid or fluid collections

Musculoskeletal: No acute or significant osseous findings.
IMPRESSION: No acute findings within the abdomen or pelvis.
